# Patient Record
Sex: Female | Born: 1985 | Race: White | Hispanic: No | Marital: Single | Smoking: Never smoker
Health system: Southern US, Community
[De-identification: ages and names within clinical notes are randomized; demographics above are authoritative.]

## PROBLEM LIST (undated history)

## (undated) DIAGNOSIS — K222 Esophageal obstruction: Secondary | ICD-10-CM

## (undated) DIAGNOSIS — F988 Other specified behavioral and emotional disorders with onset usually occurring in childhood and adolescence: Secondary | ICD-10-CM

## (undated) DIAGNOSIS — K219 Gastro-esophageal reflux disease without esophagitis: Secondary | ICD-10-CM

## (undated) HISTORY — DX: Other specified behavioral and emotional disorders with onset usually occurring in childhood and adolescence: F98.8

## (undated) HISTORY — PX: NO PAST SURGERIES: SHX2092

## (undated) HISTORY — DX: Gastro-esophageal reflux disease without esophagitis: K21.9

## (undated) HISTORY — DX: Esophageal obstruction: K22.2

## (undated) HISTORY — PX: WISDOM TOOTH EXTRACTION: SHX21

---
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
## 2007-02-10 ENCOUNTER — Encounter: Payer: Self-pay | Admitting: *Deleted

## 2007-10-27 LAB — CONVERTED CEMR LAB: Pap Smear: NORMAL

## 2008-01-11 ENCOUNTER — Ambulatory Visit: Payer: Self-pay | Admitting: Internal Medicine

## 2008-01-11 DIAGNOSIS — F988 Other specified behavioral and emotional disorders with onset usually occurring in childhood and adolescence: Secondary | ICD-10-CM | POA: Insufficient documentation

## 2008-01-22 ENCOUNTER — Encounter: Payer: Self-pay | Admitting: Internal Medicine

## 2008-02-05 ENCOUNTER — Ambulatory Visit: Payer: Self-pay | Admitting: Internal Medicine

## 2008-02-05 DIAGNOSIS — J018 Other acute sinusitis: Secondary | ICD-10-CM | POA: Insufficient documentation

## 2008-02-05 LAB — CONVERTED CEMR LAB: Inflenza A Ag: NEGATIVE

## 2008-02-19 ENCOUNTER — Encounter: Payer: Self-pay | Admitting: Internal Medicine

## 2008-03-21 ENCOUNTER — Telehealth: Payer: Self-pay | Admitting: Internal Medicine

## 2008-06-13 ENCOUNTER — Encounter: Payer: Self-pay | Admitting: Internal Medicine

## 2008-07-25 ENCOUNTER — Ambulatory Visit: Payer: Self-pay | Admitting: Internal Medicine

## 2008-08-28 ENCOUNTER — Emergency Department (HOSPITAL_BASED_OUTPATIENT_CLINIC_OR_DEPARTMENT_OTHER): Admission: EM | Admit: 2008-08-28 | Discharge: 2008-08-28 | Payer: Self-pay | Admitting: Emergency Medicine

## 2008-09-08 ENCOUNTER — Ambulatory Visit: Payer: Self-pay | Admitting: Internal Medicine

## 2008-12-06 ENCOUNTER — Telehealth: Payer: Self-pay | Admitting: Internal Medicine

## 2009-01-09 ENCOUNTER — Ambulatory Visit: Payer: Self-pay | Admitting: Internal Medicine

## 2009-01-09 DIAGNOSIS — M26629 Arthralgia of temporomandibular joint, unspecified side: Secondary | ICD-10-CM | POA: Insufficient documentation

## 2009-04-04 ENCOUNTER — Ambulatory Visit: Payer: Self-pay | Admitting: Internal Medicine

## 2009-08-17 ENCOUNTER — Telehealth: Payer: Self-pay | Admitting: Internal Medicine

## 2009-08-25 ENCOUNTER — Telehealth: Payer: Self-pay | Admitting: Internal Medicine

## 2009-08-25 ENCOUNTER — Ambulatory Visit: Payer: Self-pay | Admitting: Family Medicine

## 2009-08-29 ENCOUNTER — Telehealth (INDEPENDENT_AMBULATORY_CARE_PROVIDER_SITE_OTHER): Payer: Self-pay | Admitting: *Deleted

## 2009-09-20 ENCOUNTER — Ambulatory Visit: Payer: Self-pay | Admitting: Internal Medicine

## 2009-10-25 ENCOUNTER — Ambulatory Visit: Payer: Self-pay | Admitting: Internal Medicine

## 2009-10-25 DIAGNOSIS — E663 Overweight: Secondary | ICD-10-CM | POA: Insufficient documentation

## 2009-12-13 ENCOUNTER — Telehealth: Payer: Self-pay | Admitting: Internal Medicine

## 2010-03-15 ENCOUNTER — Ambulatory Visit: Payer: Self-pay | Admitting: Internal Medicine

## 2010-04-11 ENCOUNTER — Ambulatory Visit: Admit: 2010-04-11 | Payer: Self-pay | Admitting: Internal Medicine

## 2010-04-27 ENCOUNTER — Ambulatory Visit (INDEPENDENT_AMBULATORY_CARE_PROVIDER_SITE_OTHER)
Admission: RE | Admit: 2010-04-27 | Discharge: 2010-04-27 | Payer: PRIVATE HEALTH INSURANCE | Source: Home / Self Care | Attending: Internal Medicine | Admitting: Internal Medicine

## 2010-04-27 DIAGNOSIS — H6120 Impacted cerumen, unspecified ear: Secondary | ICD-10-CM

## 2010-04-27 DIAGNOSIS — F988 Other specified behavioral and emotional disorders with onset usually occurring in childhood and adolescence: Secondary | ICD-10-CM

## 2010-05-01 NOTE — Progress Notes (Signed)
Summary: seen friday, now has drainage  Phone Note Call from Patient Call back at [facility] Phone [phone]   Caller: Patient Summary of Call: Patient called and left msg, was seen on friday, pus and drainage now started saturday, was told to call and a abx would be called in .Kandice Hams  Aug 29, 2009 9:26 AM  Initial call taken by: Kandice Hams,  Aug 29, 2009 9:26 AM  Follow-up for Phone Call        prescription sent.  please call pt Follow-up by: Neena Rhymes MD,  Aug 29, 2009 9:42 AM  Additional Follow-up for Phone Call Additional follow up Details #1::        SPOKE WITH PT INFORMED OF NEW RX .Kandice Hams  Aug 29, 2009 10:29 AM  Additional Follow-up by: Kandice Hams,  Aug 29, 2009 10:29 AM    New/Updated Medications: VIGAMOX 0.5 % SOLN (MOXIFLOXACIN HCL) 1 drop in affected eye three times a day x7 days Prescriptions: VIGAMOX 0.5 % SOLN (MOXIFLOXACIN HCL) 1 drop in affected eye three times a day x7 days  #3 ml x 1   Entered and Authorized by:   Neena Rhymes MD   Signed by:   Neena Rhymes MD on 08/29/2009   Method used:   Electronically to        [facility] [address] (retail)       [address]       [place], [place]  [address]       Ph: [phone] or [phone]       Fax: [phone]   RxID:   [id number]

## 2010-05-01 NOTE — Assessment & Plan Note (Signed)
Summary: pink eye/mhf   Vital Signs:  Patient profile:   25 year old female Weight:      181 pounds Temp:     98.8 degrees F oral BP sitting:   120 / 80  (left arm)  Vitals Entered By: Doristine Devoid (Aug 25, 2009 2:48 PM) CC: R eye redness and itching along w/ some burning    History of Present Illness: 25 yo woman here today w/ R eye redness.  woke up w/ eye 'sealed shut w/ crust' this AM.  some watery drainage today.  + itching and burning.  + contact w/ pink eye.  no fevers, chills, nasal congestion, or URI sxs.  + sneezing.  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  Allergies (verified): No Known Drug Allergies  Review of Systems      See HPI  Physical Exam  General:  alert, well-developed, and well-nourished.   Head:  normocephalic and atraumatic.   Eyes:  R eye w/ mild injxn and inflammation.  PERRL, EOMI, no drainage L eye normal Ears:  cerumen bilaterally Nose:  edematous turbinates Mouth:  + PND   Impression & Recommendations:  Problem # 1:  CONJUNCTIVITIS (ICD-372.30) Assessment New most likely allergic- thin, watery drainage and no pus.  start patanase daily for allergic sxs.  reviewed supportive care and red flags that should prompt return.  Pt expresses understanding and is in agreement w/ this plan.  Complete Medication List: 1)  Multivitamins Tabs (Multiple vitamin) .... Take one tablet once daily 2)  Vyvanse 60 Mg Caps (Lisdexamfetamine dimesylate) .... One by mouth qd 3)  Vyvanse 60 Mg Caps (Lisdexamfetamine dimesylate) .... One by mouth once daily  (fill on or after 05/05/2009) 4)  Vyvanse 60 Mg Caps (Lisdexamfetamine dimesylate) .... One po qd - fill on or after3/06/2009  Patient Instructions: 1)  This appears to be allergy related and not bacterial 2)  Use the Patanase- 2 sprays each nostril two times a day (if the samples don't last, call for a prescription) 3)  Start OTC Claritin or Zyrtec 4)  If you develop thick, yellow/green drainage/pus from  the eye please call and we can call in an antibiotic 5)  Visine Allergy drops may relieve the itching/burning of your eyes 6)  Hang in there! 7)  Have a great holiday!

## 2010-05-01 NOTE — Progress Notes (Signed)
Summary: Ear feel "full"  Phone Note Call from Patient Call back at [phone]   Caller: Mom Reason for Call: Talk to Nurse Summary of Call: Mother states her daughter is no better, her ear still feels like it's full of something even after using washes, she wants to know if there is anything else that can be prescribed Initial call taken by: Lannette Donath,  December 13, 2009 4:36 PM  Follow-up for Phone Call        call returned to patient at [phone]. Patients mother Connie Murillo states  patient has tried the over the counter stuff that Dr Artist Pais advised, and she is not any better. Connie Murillo was wanting to know if Dr Artist Pais would prescribed something for the patient. She was advised that patient will need appointment for evaluation if there was no improvement.  The patients mother is wanting to know if patient could be referred to a ENT, she is currently away at school, and is unable to come in for appointment. She would like to know what Dr Artist Pais advises Follow-up by: Glendell Docker CMA,  December 13, 2009 5:02 PM  Additional Follow-up for Phone Call Additional follow up Details #1::        ok to refer to ENT near her school pt can call her ins co to see list of ENTs that accept her ins Additional Follow-up by: D. Thomos Lemons DO,  December 13, 2009 8:55 PM    Additional Follow-up for Phone Call Additional follow up Details #2::    Left detailed message on mother's cell phone re: Dr Olegario Messier instructions and to call us back when she knows the name of an ENT around her daughter's school. [name] CMA Duncan Dull)  December 14, 2009 8:31 AM

## 2010-05-01 NOTE — Assessment & Plan Note (Signed)
Summary: vyvanse refill/mhf--Rm 2   Vital Signs:  Patient profile:   25 year old female Height:      65 inches Weight:      181.25 pounds BMI:     30.27 Temp:     98.5 degrees F oral Pulse rate:   72 / minute Pulse rhythm:   regular Resp:     16 per minute BP sitting:   120 / 88  (left arm) Cuff size:   regular  Vitals Entered By: Mervin Kung CMA Duncan Dull) (October 25, 2009 11:17 AM) CC: Room 2    Follow for refill of  Vyvanse. Is Patient Diabetic? No Comments Pt has completed Vigamox. 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   Lungs:  normal respiratory effort and normal breath sounds.   Heart:  normal rate, regular rhythm, and no gallop.   Psych:  normally interactive, good eye contact, not anxious appearing, and not depressed appearing.     Impression & Recommendations:  Problem # 1:  ADD (ICD-314.00) Assessment Unchanged continue vyvanse  Problem # 2:  OVERWEIGHT (ICD-278.02) Pt counseled on diet and exercise.  Ht: 65 (10/25/2009)   Wt: 181.25 (10/25/2009)   BMI: 30.27 (10/25/2009)  Complete Medication List: 1)  Multivitamins Tabs (Multiple vitamin) .... Take one tablet once daily 2)  Vyvanse 60 Mg Caps (Lisdexamfetamine dimesylate) .... One by mouth qd 3)  Vyvanse 60 Mg Caps (Lisdexamfetamine dimesylate) .... One by mouth once daily  (fill on or after 11/25/2009) 4)  Vyvanse 60 Mg Caps (Lisdexamfetamine dimesylate) .... One po qd - fill on or after  12/26/2009   Patient Instructions: 1)  Please schedule a follow-up appointment in 3 months ( Nurse visit) Prescriptions: VYVANSE 60 MG CAPS (LISDEXAMFETAMINE DIMESYLATE) one po qd - fill on or after 12/26/2009  #30 x 0   Entered and Authorized by:   D. Thomos Lemons DO   Signed by:   D. Thomos Lemons DO on 10/25/2009   Method used:   Print then Give to Patient   RxID:   [id number] VYVANSE 60 MG CAPS (LISDEXAMFETAMINE DIMESYLATE) one by mouth once daily  (fill on or after 11/25/2009)  #30 x 0   Entered and Authorized by:   D. Thomos Lemons DO   Signed by:   D. Thomos Lemons DO on 10/25/2009   Method used:   Print then Give to Patient   RxID:   [id number] VYVANSE 60 MG CAPS (LISDEXAMFETAMINE DIMESYLATE) one by mouth qd  #30 x 0   Entered and Authorized by:   D. Thomos Lemons DO   Signed by:   D. Thomos Lemons DO on 10/25/2009   Method used:   Print then Give to Patient   RxID:   [id number]   Current Allergies (reviewed today): No known allergies

## 2010-05-01 NOTE — Progress Notes (Signed)
Summary: Vyvanse refill  Phone Note Refill Request Call back at [phone] Message from:  Patient on Aug 17, 2009 10:55 AM  Refills Requested: Medication #1:  VYVANSE 60 MG CAPS one by mouth qd   Dosage confirmed as above?Dosage Confirmed   Supply Requested: 1 month Pt. scheduled f/u for 09/20/09 @ 9am.  She will be out of Vyvanse before she returns from school and would like rx. mailed to home address for her mother to forward to her at school.  Mervin Kung CMA  Aug 17, 2009 10:56 AM    Follow-up for Phone Call        we can not prescribe w/o office visit  Follow-up by: D. Thomos Lemons DO,  Aug 17, 2009 2:04 PM  Additional Follow-up for Phone Call Additional follow up Details #1::        patient advised per Dr Artist Pais instructions Additional Follow-up by: Glendell Docker CMA,  Aug 17, 2009 2:43 PM

## 2010-05-01 NOTE — Assessment & Plan Note (Signed)
Summary: 3 month follow up/mhf   Vital Signs:  Patient profile:   25 year old female Weight:      163.50 pounds BMI:     27.31 O2 Sat:      98 % on Room air Temp:     98.3 degrees F oral Pulse rate:   72 / minute Pulse rhythm:   regular Resp:     16 per minute BP sitting:   120 / 90  (left arm) Cuff size:   large  Vitals Entered By: Glendell Docker CMA (April 04, 2009 3:09 PM)  O2 Flow:  Room air  Contraindications/Deferment of Procedures/Staging:    Test/Procedure: FLU VAX    Reason for deferment: patient declined   Primary Care Provider:  DThomos Lemons DO  CC:  3 month follow up.  History of Present Illness: 3 month follow up evaulation of right ear-sense of fullness for the past week  25 year old for routine followup regarding ADD. Her symptoms are well-controlled. She had very good previous semester. Mild weight loss.  She also complains of fullness in right ear. She has history of cerumen impaction. 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   Ears:  right cerumen impaction Lungs:  normal respiratory effort and normal breath sounds.   Heart:  normal rate, regular rhythm, and no gallop.     Impression & Recommendations:  Problem # 1:  CERUMEN IMPACTION, RIGHT (ICD-380.4) Assessment New  irrigated right ear and utilized currette to remove cerumen plug. No complications. Immediate hearing improvement.  Orders: Cerumen Impaction Removal (36644)  Problem # 2:  ADD (ICD-314.00) Assessment: Unchanged stable.  mild wt loss.  BP stable.   Maintain current medication regimen.  Complete Medication List: 1)  Multivitamins Tabs (Multiple vitamin) .... Take one tablet once daily 2)  Vyvanse 60 Mg Caps (Lisdexamfetamine dimesylate) .... One by mouth qd 3)  Vyvanse 60 Mg Caps (Lisdexamfetamine dimesylate) .... One by mouth once daily  (fill on or after 05/05/2009) 4)  Vyvanse 60 Mg Caps (Lisdexamfetamine dimesylate) .... One po qd - fill on or after3/06/2009  Patient Instructions: 1)  Use debox ear wax softener 2)  Use warm water mixed with hydrogen peroxide to irrigate your ears. 3)  Please schedule a follow-up appointment in 3 months. Prescriptions: VYVANSE 60 MG CAPS (LISDEXAMFETAMINE DIMESYLATE) one po qd - fill on or after3/06/2009  #30 x 0   Entered and Authorized by:   D. Thomos Lemons DO   Signed by:   D. Thomos Lemons DO on 04/04/2009   Method used:   Print then Give to Patient   RxID:   [id number] VYVANSE 60 MG CAPS (LISDEXAMFETAMINE DIMESYLATE) one by mouth once daily  (fill on or after 05/05/2009)  #30 x 0   Entered and Authorized by:   D. Thomos Lemons DO   Signed by:   D. Thomos Lemons DO on 04/04/2009   Method used:   Print then Give to Patient   RxID:   [id number] VYVANSE 60 MG CAPS (LISDEXAMFETAMINE DIMESYLATE) one by mouth qd  #  30 x 0   Entered and Authorized by:   D. Thomos Lemons DO   Signed by:   D. Thomos Lemons DO on 04/04/2009   Method used:   Print then Give to Patient   RxID:   [id number]    Immunization History:  Influenza Immunization History:    Influenza:  declined (04/04/2009)   Current Allergies (reviewed today): No known allergies

## 2010-05-01 NOTE — Progress Notes (Signed)
Summary: Eye Irritation  Phone Note Call from Patient Call back at Home Phone [phone]   Caller: Patient Call For: D. Thomos Lemons DO Summary of Call: patient called and left voice message stating she has swelling in her eyes , irritation and drainage, and drainage she is wanting to know if she could be seen.  Call was returned to patient she was informed that office visit was needed for evaluation. She has been scheduled with the [facility] office Initial call taken by: Glendell Docker CMA,  Aug 25, 2009 1:48 PM

## 2010-05-17 NOTE — Assessment & Plan Note (Signed)
Summary: MEDICATION REFILL/HEA   Vital Signs:  Patient profile:   25 year old female Height:      65 inches Weight:      182.50 pounds BMI:     30.48 O2 Sat:      98 % on Room air Temp:     97.9 degrees F oral Pulse rate:   71 / minute Resp:     18 per minute BP sitting:   112 / 80  (left arm) Cuff size:   large  Vitals Entered By: Glendell Docker CMA (April 27, 2010 2:37 PM)  O2 Flow:  Room air CC: Medication Refill Is Patient Diabetic? No Pain Assessment Patient in pain? no      Comments c/o fullness in ears with difficulty hearing, onset Monday    Primary Care Provider:  Dondra Spry DO  CC:  Medication Refill.  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   Ears:  bilateral cerumen impaction Lungs:  normal respiratory effort and normal breath sounds.   Heart:  normal rate, regular rhythm, and no gallop.   Psych:  normally interactive, good eye contact, not anxious appearing, and not depressed appearing.     Impression & Recommendations:  Problem # 1:  CERUMEN IMPACTION, BILATERAL (ICD-380.4)  irrigated and utilized currette to remove cerumen plug from left and right ear  Orders: Cerumen Impaction Removal (29562)  Problem # 2:  ADD (ICD-314.00) Assessment: Unchanged Maintain current medication  regimen.  Complete Medication List: 1)  Multivitamins Tabs (Multiple vitamin) .... Take one tablet once daily 2)  Vyvanse 60 Mg Caps (Lisdexamfetamine dimesylate) .... One by mouth qd 3)  Vyvanse 60 Mg Caps (Lisdexamfetamine dimesylate) .... One by mouth once daily  (fill on or after 05/28/2010) 4)  Vyvanse 60 Mg Caps (Lisdexamfetamine dimesylate) .... One by mouth once daily (fill on or after 06/26/2010)  Patient Instructions: 1)  Please schedule a follow-up appointment in 5 months. Prescriptions: VYVANSE 60 MG CAPS (LISDEXAMFETAMINE DIMESYLATE) one by mouth once daily (fill on or after 06/26/2010)  #30 x 0   Entered and Authorized by:   D. Thomos Lemons DO   Signed by:   D. Thomos Lemons DO on 04/27/2010   Method used:   Print then Give to Patient   RxID:   [id number] VYVANSE 60 MG CAPS (LISDEXAMFETAMINE DIMESYLATE) one by mouth once daily  (fill on or after 05/28/2010)  #30 x 0   Entered and Authorized by:   D. Thomos Lemons DO   Signed by:   D. Thomos Lemons DO on 04/27/2010   Method used:   Print then Give to Patient   RxID:   [id number] VYVANSE 60 MG CAPS (LISDEXAMFETAMINE DIMESYLATE) one by mouth qd  #30 x 0   Entered and Authorized by:  Dondra Spry DO   Signed by:   D. Thomos Lemons DO on 04/27/2010   Method used:   Print then Give to Patient   RxID:   [id number]    Orders Added: 1)  Est. Patient Level III [14782] 2)  Cerumen Impaction Removal [69210]   Immunization History:  Influenza Immunization History:    Influenza:  declined (04/27/2010)   Immunization History:  Influenza Immunization History:    Influenza:  Declined (04/27/2010)

## 2010-06-27 ENCOUNTER — Telehealth: Payer: Self-pay | Admitting: *Deleted

## 2010-06-27 NOTE — Telephone Encounter (Signed)
Patients mother called and left voice message stating patient misplaced her last Vyvanse rx and she would like to know if Dr Artist Pais would authorize a rx  And mail it to patient at school [address], [place], [place] [address]

## 2010-06-28 NOTE — Telephone Encounter (Signed)
Pt responsible for her rx.   Vyvanse is controlled substance.   We can not provide replacement rx

## 2010-06-28 NOTE — Telephone Encounter (Signed)
Call returned to patients mother Connie Murillo  at [phone], no answer. A detailed voice message was left informing patients mother per Dr Artist Pais instructions

## 2010-08-17 ENCOUNTER — Telehealth: Payer: Self-pay | Admitting: Internal Medicine

## 2010-08-17 DIAGNOSIS — F988 Other specified behavioral and emotional disorders with onset usually occurring in childhood and adolescence: Secondary | ICD-10-CM

## 2010-08-17 NOTE — Assessment & Plan Note (Signed)
[facility]                           PRIMARY CARE OFFICE NOTE   NAME:Connie Murillo, Connie Murillo                  MRN:          [id number]  DATE:08/04/2006                            DOB:          02-Jan-1986    CHIEF COMPLAINT:  New patient to the practice/cough and sore throat.   HISTORY OF PRESENT ILLNESS:  The patient is an 25 year old white female  here to establish primary care.  She was previously followed by Dr.  Oliver Pila, local pediatrician.  She has had routine childhood illnesses.  She is not sure whether she has had chickenpox, also unsure whether she  received the varicella vaccine.  Her main complaint today has been cough  and a sore throat for 1 week.  There is no associated fever, some mild  yellowish sputum.  Several of her friends are noted to have similar  symptoms.  She denies any swollen lymph nodes and denies excessive  fatigue.   CURRENT MEDICATIONS:  1. Tylenol p.r.n.  2. Multivitamin.   ALLERGIES TO MEDICATIONS:  None known.   SOCIAL HISTORY:  The patient is single, currently a senior at the  [employer].   FAMILY HISTORY:  Mother and father are living; mother is 21; father is  age 20.  Father has asthma.  She also has an older brother with asthma.   REVIEW OF SYSTEMS:  As noted above.  Positive postnasal drip.  No chest  pain.  No history of coughing or asthma with exercise.  Denies  heartburn, nausea, vomiting, constipation or diarrhea.  All other  systems negative.   PHYSICAL EXAMINATION:  VITALS:  Weight is 140 pounds.  Temperature is  98.4, pulse is 72, BP is 117/82.  GENERAL:  The patient is a pleasant, well-developed, well-nourished 41-  year-old white female in no apparent distress.  HEENT:  Normocephalic, atraumatic.  Pupils are equal and reactive to  light bilaterally.  Extraocular motility was intact.  The patient was  anicteric.  Conjunctivae were within normal limits.  The patient's  oropharyngeal  exam revealed mild cobblestoning and mild pharyngeal  erythema, otherwise unremarkable.  There were no tonsillar exudates.  NECK:  Supple without any evidence of adenopathy, carotid bruit or  thyromegaly.  CHEST:  Normal respiratory effort.  Chest was clear to auscultation  bilaterally, no rhonchi, rales or wheezing.  CARDIOVASCULAR:  Regular rate and rhythm.  No significant murmurs, rubs,  or gallops appreciated.  ABDOMEN:  Soft and nontender.  Positive bowel sounds.  Unable to palpate  her spleen or liver.  MUSCULOSKELETAL:  No clubbing, cyanosis or edema.  SKIN:  Warm and dry.  NEUROLOGIC:  Cranial nerves II-XII were grossly intact.  She was  nonfocal.   Rapid Strep test was performed in the office, which was negative.   IMPRESSION:  1. Cough secondary to probable upper respiratory infection.  2. Health maintenance.   RECOMMENDATIONS:  The patient is to try Zyrtec 10 mg p.o. nightly and  also nasal irrigation.  If the patient's symptoms do not resolve or the  patient has fever and persistent yellow-green nasal discharge, she is to  take Ceftin 500 mg p.o. b.i.d. for 10 days.   I recommended Gardasil and we will need to ascertain status of other  immunizations.  We will request immunization records from her  pediatrician.   I recommended continued followup with her OB/GYN for routine Pap and  pelvics.  Follow up in 1 or 2 months.     Barbette Hair. Artist Pais, DO  Electronically Signed    RDY/MedQ  DD: 08/04/2006  DT: 08/05/2006  Job #: [id number]

## 2010-08-17 NOTE — Telephone Encounter (Signed)
Pt mom states that pt is out of vyvance and would like [place] to mail rx to pt.   [address]  [place] [address]

## 2010-08-17 NOTE — Telephone Encounter (Signed)
If pt still in school at [employer], she can have one vyvanse refill. But she needs OV for additional refills

## 2010-08-20 NOTE — Telephone Encounter (Signed)
Call placed  [phone], no answer. A detailed voice message was left for patient to return phone call regarding her Rx.

## 2010-08-20 NOTE — Telephone Encounter (Signed)
Pt returned phone call. Pt states that she is still in school at [employer]. Best # [phone] to reach her at.

## 2010-08-21 MED ORDER — LISDEXAMFETAMINE DIMESYLATE 60 MG PO CAPS: 60.0000 mg | ORAL_CAPSULE | ORAL | Status: DC

## 2010-08-21 NOTE — Telephone Encounter (Signed)
Call placed to patient at [phone], she was informed per Dr Artist Pais instructions of the need for follow up office visit. She has verbalized understanding and stated that she will be back in the area in about 3 weeks. She requested the Rx be mailed to her at  [address] [place], [place] [address].

## 2010-08-21 NOTE — Telephone Encounter (Signed)
Rx mailed to patient to address on file

## 2010-09-04 ENCOUNTER — Telehealth: Payer: Self-pay | Admitting: Internal Medicine

## 2010-09-04 NOTE — Telephone Encounter (Signed)
Pt states that she has never received vyvance rx through the mail(previous note). Pt would like another rx sent to mom's address at [address] [place], [place] [address]

## 2010-09-05 NOTE — Telephone Encounter (Signed)
Call returned to patient at [phone], she was advised to wait until the end of the week to see if she has received the rx, and if not she was advised to call back. Patient has verbalized understanding and agrees

## 2010-09-11 ENCOUNTER — Telehealth: Payer: Self-pay | Admitting: Internal Medicine

## 2010-09-11 NOTE — Telephone Encounter (Signed)
Pt states that she has not received her medication    Vyvanse 60mg   (she is school in [place] )  Please call   [phone]

## 2010-09-12 NOTE — Telephone Encounter (Signed)
Patient has requested mailed  Rx for Vyvanse. Rx was mailed to patients provided address in [place], [place], which patients states she has not received. She is requesting another Rx to be mailed to her at her local address. She was previously informed. Authorization may not be granted for addittonal refill and she may be required to return for office visit which she has verbalized understanding at the time. Patient has verified that the address previously provided for Connie Murillo is correct as documented. She was informed Dr Artist Pais is out of the office and I would authorization would be up to covering provider. She has verbalized understanding that the Rx may or may not be refilled.

## 2010-09-12 NOTE — Telephone Encounter (Signed)
Call place to patient at [phone], she was informed office visit was needed; sent to appointments to schedule

## 2010-09-12 NOTE — Telephone Encounter (Signed)
She needs office visit pls.

## 2010-09-19 ENCOUNTER — Encounter: Payer: Self-pay | Admitting: Internal Medicine

## 2010-09-21 ENCOUNTER — Encounter: Payer: Self-pay | Admitting: Internal Medicine

## 2010-09-21 ENCOUNTER — Ambulatory Visit: Payer: PRIVATE HEALTH INSURANCE | Admitting: Family

## 2010-09-21 ENCOUNTER — Ambulatory Visit (INDEPENDENT_AMBULATORY_CARE_PROVIDER_SITE_OTHER): Admitting: Family

## 2010-09-21 ENCOUNTER — Encounter: Payer: Self-pay | Admitting: Family

## 2010-09-21 VITALS — BP 100/70 | HR 60 | Temp 98.0°F | Resp 16 | Ht 65.0 in | Wt 169.0 lb

## 2010-09-21 DIAGNOSIS — F988 Other specified behavioral and emotional disorders with onset usually occurring in childhood and adolescence: Secondary | ICD-10-CM

## 2010-09-21 MED ORDER — LISDEXAMFETAMINE DIMESYLATE 60 MG PO CAPS: 60.0000 mg | ORAL_CAPSULE | ORAL | Status: DC

## 2010-09-21 NOTE — Patient Instructions (Signed)
Follow up in 6 months 

## 2010-09-21 NOTE — Progress Notes (Signed)
  Subjective:    Patient ID: Connie Murillo, female    DOB: October 27, 1985, 25 y.o.   MRN: [id number]  HPI Connie Murillo is a 25 year old college due to who presents today for followup of her ADD.  ADD- diagnosed in 2010, now on Vayvanse.  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  She reports that she has been able to focus on her school work.  She reports that she has been trying to lose weight.  She has lost greater than 10 pounds since her last visit in January. She denies any adverse side effects from the Vyvanse.    Review of Systems See history of present illness  Past Medical History  Diagnosis Date  . ADD (attention deficit disorder)     History   Social History  . Marital Status: Single    Spouse Name: N/A    Number of Children: N/A  . Years of Education: N/A   Occupational History  . Not on file.   Social History Main Topics  . Smoking status: Never Smoker   . Smokeless tobacco: Not on file  . Alcohol Use: Not on file  . Drug Use: Not on file  . Sexually Active: Not on file   Other Topics Concern  . 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 No past surgeries     denies surgical history    Family History  Problem Relation Age of Onset  . Asthma Father   . Asthma Brother   . Other Neg Hx     No FH of thyroid d/o    No Known Allergies  Current Outpatient Prescriptions on File Prior to Visit  Medication Sig Dispense Refill  . lisdexamfetamine (VYVANSE) 60 MG capsule Take 60 mg by mouth every morning. Fill on or after      . Multiple Vitamin (MULTIVITAMIN) tablet Take 1 tablet by mouth daily.        Marland Kitchen lisdexamfetamine (VYVANSE) 60 MG capsule Take 60 mg by mouth every morning. Fill on or after      . DISCONTD: lisdexamfetamine (VYVANSE) 60 MG capsule Take 1 capsule (60 mg total) by mouth every morning.  30 capsule  0  . DISCONTD:  lisdexamfetamine (VYVANSE) 60 MG capsule Take 1 capsule (60 mg total) by mouth every morning.  30 capsule  0    BP 100/70  Pulse 60  Temp(Src) 98 F (36.7 C) (Oral)  Resp 16  Ht 5\' 5"  (1.651 m)  Wt 169 lb (76.658 kg)  BMI 28.12 kg/m2  LMP 09/07/2010       Objective:   Physical Exam  General: Pleasant white female awake, alert, and in no acute distress Cardiovascular S1, S2, regular rate and rhythm Respiratory: Breath sounds clear to auscultation bilaterally without wheezes rales or rhonchi Psychiatric: Awake and oriented x3 calm and pleasant.        Assessment & Plan:

## 2010-09-21 NOTE — Assessment & Plan Note (Addendum)
This is clinically stable and improved. Continue Vyvanse at current dose. Followup in 6 months. A printed prescription for a one-month supply was handed to the patient today. 15 minutes were spent with the patient today. Greater than 50% of this time spent counseling the patient on her ADD.

## 2010-10-22 ENCOUNTER — Encounter: Payer: Self-pay | Admitting: *Deleted

## 2010-10-22 ENCOUNTER — Telehealth: Payer: Self-pay | Admitting: Internal Medicine

## 2010-10-22 NOTE — Telephone Encounter (Signed)
Spoke with patient's mother. Patient is seeing her PCP tomorrow for difficulty swallowing for several weeks. Offered to schedule patient with an extender but patient's mother wants her to see Dr. Juanda Chance. Scheduled her on 11/05/10 at 9:00 AM. Letter mailed.

## 2010-10-23 ENCOUNTER — Ambulatory Visit (INDEPENDENT_AMBULATORY_CARE_PROVIDER_SITE_OTHER): Admitting: Internal Medicine

## 2010-10-23 ENCOUNTER — Ambulatory Visit: Admitting: Family

## 2010-10-23 ENCOUNTER — Telehealth: Payer: Self-pay

## 2010-10-23 ENCOUNTER — Encounter: Payer: Self-pay | Admitting: Internal Medicine

## 2010-10-23 ENCOUNTER — Ambulatory Visit: Admitting: Internal Medicine

## 2010-10-23 DIAGNOSIS — F988 Other specified behavioral and emotional disorders with onset usually occurring in childhood and adolescence: Secondary | ICD-10-CM

## 2010-10-23 DIAGNOSIS — Z0000 Encounter for general adult medical examination without abnormal findings: Secondary | ICD-10-CM | POA: Insufficient documentation

## 2010-10-23 DIAGNOSIS — K219 Gastro-esophageal reflux disease without esophagitis: Secondary | ICD-10-CM | POA: Insufficient documentation

## 2010-10-23 DIAGNOSIS — R1310 Dysphagia, unspecified: Secondary | ICD-10-CM | POA: Insufficient documentation

## 2010-10-23 MED ORDER — ESOMEPRAZOLE MAGNESIUM 40 MG PO CPDR: 40.0000 mg | DELAYED_RELEASE_CAPSULE | Freq: Every day | ORAL | Status: AC

## 2010-10-23 MED ORDER — LISDEXAMFETAMINE DIMESYLATE 60 MG PO CAPS: 60.0000 mg | ORAL_CAPSULE | ORAL | Status: DC

## 2010-10-23 MED ORDER — PANTOPRAZOLE SODIUM 40 MG PO TBEC: 40.0000 mg | DELAYED_RELEASE_TABLET | Freq: Every day | ORAL | Status: DC

## 2010-10-23 NOTE — Assessment & Plan Note (Signed)
Ok for protonix 40 qd,  to f/u any worsening symptoms or concerns  

## 2010-10-23 NOTE — Assessment & Plan Note (Signed)
To solid and liquids, I suspect reflux related/functional, for PPI tx and refer to GI per pt request, for barium esophogram as well

## 2010-10-23 NOTE — Progress Notes (Signed)
  Subjective:    Patient ID: Connie Murillo, female    DOB: 1986-01-22, 25 y.o.   MRN: [id number]  HPI  Here to f/u, is established pt with Dr Artist Pais, who has health problem, now out of the office with pt transferring care.   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  Mother has seen Dr Marlowe Shores with egd and dilation and since she has trouble with solids and liquids that she might need the same.  Has not tried any OTC meds, and Denies abd pain, n/v, bowel change or blood or wt loss.  Also requests vyvanse refill, curretnly doing well with good concentration, task completion, completing college soon with only one semester left.  Pt denies chest pain, increased sob or doe, wheezing, orthopnea, PND, increased LE swelling, palpitations, dizziness or syncope.  Pt denies new neurological symptoms such as new headache, or facial or extremity weakness or numbness  Pt denies polydipsia, polyuria.  Denies worsening depressive symptoms, suicidal ideation, or panic. Past Medical History  Diagnosis Date  . ADD (attention deficit disorder)    Past Surgical History  Procedure Date  . No past surgeries     denies surgical history    reports that she has never smoked. She does not have any smokeless tobacco history on file. Her alcohol and drug histories not on file. family history includes Asthma in her brother and father.  There is no history of Other. No Known Allergies Current Outpatient Prescriptions on File Prior to Visit  Medication Sig Dispense Refill  . drospirenone-ethinyl estradiol (YAZ) 3-0.02 MG per tablet Take 1 tablet by mouth daily.        . Multiple Vitamin (MULTIVITAMIN) tablet Take 1 tablet by mouth daily.         Review of Systems Review of Systems  Constitutional: Negative for diaphoresis and unexpected weight change.  HENT: Negative for drooling and  tinnitus.   Eyes: Negative for photophobia and visual disturbance.  Respiratory: Negative for choking and stridor.   Gastrointestinal: Negative for vomiting and blood in stool.  Genitourinary: Negative for hematuria and decreased urine volume.        Objective:   Physical Exam BP 112/82  Pulse 65  Temp(Src) 99.4 F (37.4 C) (Oral)  Ht 5\' 3"  (1.6 m)  Wt 161 lb 2 oz (73.086 kg)  BMI 28.54 kg/m2  SpO2 99%  LMP 10/10/2010 Physical Exam  VS noted Constitutional: Pt appears well-developed and well-nourished.  HENT: Head: Normocephalic.  Right Ear: External ear normal.  Left Ear: External ear normal.  Eyes: Conjunctivae and EOM are normal. Pupils are equal, round, and reactive to light.  Neck: Normal range of motion. Neck supple.  Cardiovascular: Normal rate and regular rhythm.   Pulmonary/Chest: Effort normal and breath sounds normal.  Abd:  Soft, NT, +BS Neurological: Pt is alert. No cranial nerve deficit.  Skin: Skin is warm. No erythema.  Psychiatric: Pt behavior is normal. Thought content normal. Not anxious or depressed appearing       Assessment & Plan:

## 2010-10-23 NOTE — Telephone Encounter (Signed)
Ok for nexium, as the protonix is only by PA

## 2010-10-23 NOTE — Telephone Encounter (Signed)
Pharmacy requesting that Pantoprazole by prior authorized or try Prilosec or nexium, please advise

## 2010-10-23 NOTE — Patient Instructions (Signed)
Take all new medications as prescribed - the protonix (generic) Continue all other medications as before - including the vyvanse refill today You will be contacted regarding the referral for: Esophagram, and Dr Marlowe Shores Please return in 1 year for your yearly visit, or sooner if needed

## 2010-10-23 NOTE — Assessment & Plan Note (Signed)
stable overall by hx and exam, and pt to continue medical treatment as before 

## 2010-10-24 NOTE — Telephone Encounter (Signed)
Ok for nexium need dosage and directions for this patient

## 2010-10-24 NOTE — Telephone Encounter (Signed)
I already sent in new rx

## 2010-10-30 ENCOUNTER — Ambulatory Visit (HOSPITAL_COMMUNITY)
Admission: RE | Admit: 2010-10-30 | Discharge: 2010-10-30 | Disposition: A | Discharge status: Home / Self Care | Source: Ambulatory Visit | Attending: Internal Medicine | Admitting: Internal Medicine

## 2010-10-30 DIAGNOSIS — K449 Diaphragmatic hernia without obstruction or gangrene: Secondary | ICD-10-CM | POA: Insufficient documentation

## 2010-10-30 DIAGNOSIS — R1310 Dysphagia, unspecified: Secondary | ICD-10-CM | POA: Insufficient documentation

## 2010-11-01 ENCOUNTER — Telehealth: Payer: Self-pay | Admitting: Internal Medicine

## 2010-11-01 NOTE — Telephone Encounter (Signed)
Patient's mother wants to know if patient could be seen before she returns to school on Monday. Told her I have no appointments.

## 2010-11-01 NOTE — Telephone Encounter (Signed)
Left a message for Janet to call me.

## 2010-11-05 ENCOUNTER — Encounter: Payer: Self-pay | Admitting: Internal Medicine

## 2010-11-05 ENCOUNTER — Telehealth: Payer: Self-pay | Admitting: Internal Medicine

## 2010-11-05 ENCOUNTER — Ambulatory Visit (INDEPENDENT_AMBULATORY_CARE_PROVIDER_SITE_OTHER): Admitting: Internal Medicine

## 2010-11-05 VITALS — BP 118/80 | HR 80 | Ht 63.5 in | Wt 162.0 lb

## 2010-11-05 DIAGNOSIS — R1319 Other dysphagia: Secondary | ICD-10-CM

## 2010-11-05 MED ORDER — LORAZEPAM 0.5 MG PO TABS: ORAL_TABLET | ORAL | Status: DC

## 2010-11-05 NOTE — Patient Instructions (Addendum)
You have been scheduled for an endoscopy. Please follow written instructions given to you at your visit today. CC: Dr Thomos Lemons, DR J.John

## 2010-11-05 NOTE — Telephone Encounter (Signed)
Ativan .5 mg. #3,  1 po q 8hrs prior to the procedure, may take the first one the night before the procedure

## 2010-11-05 NOTE — Telephone Encounter (Signed)
Spoke with patient and she has high anxiety about the EGD and wants to know if Dr. Juanda Chance will prescribe something to take prior to the procedure. Please, advise.

## 2010-11-05 NOTE — Progress Notes (Signed)
Connie Murillo 1986-03-04 MRN [id number]    History of Present Illness:  This is a 25 year old white female with a 6 month history of intermittent dysphagia to solids and liquids. She has episodes of regurgitation of food or liquids; she had an episode of choking on a sandwich yesterday. Symptoms started in February 2012 and were occuring once a month initially but recently she has had episodes every several days. She denies heartburn. She was put on Nexium 40 mg daily 2 weeks ago but has not noticed any difference. She denies any problems with her GI tract previously. She denies joint pains or thyroid problems. There have been no weight changes. A barium swallow on 10/23/2010 showed normal esophageal motility with a small hiatal hernia and no evidence of stricture. She is on medication for ADD including Vyvanse 60 mg daily.   Past Medical History  Diagnosis Date  . ADD (attention deficit disorder)   . GERD (gastroesophageal reflux disease)   . Hiatal hernia    Past Surgical History  Procedure Date  . No past surgeries     denies surgical history    reports that she has never smoked. She has never used smokeless tobacco. She reports that she drinks alcohol. She reports that she does not use illicit drugs. family history includes Asthma in her brother and father and Irritable bowel syndrome in her mother.  There is no history of Other and Colon cancer. No Known Allergies      Review of Systems: Denies change in bowel habits, weight changes, shortness of breath or chest pain  The remainder of the 10  point ROS is negative except as outlined in H&P   Physical Exam: General appearance  Well developed, in no distress. Eyes- non icteric. HEENT nontraumatic, normocephalic. Mouth no lesions, tongue papillated, no cheilosis. Neck supple without adenopathy, thyroid not enlarged, no carotid bruits, no JVD. Lungs Clear to auscultation bilaterally. Cor normal S1 normal S2, regular  rhythm , no murmur,  quiet precordium. Abdomen soft nontender asked abdomen with normal active bowel sounds. No distention. Liver edge at costal margin. Rectal: Not done. Extremities no pedal edema. Skin no lesions. Neurological alert and oriented x 3. Psychological normal mood and affect.  Assessment and Plan:  Problem #1 Dysphagia to solids and liquids but predominantly to solids without evidence of an esophageal stricture on a recent barium swallow. Esophageal dysmotility may be a problem, although propulsive peristalsis on her x-ray appeared normal. Schatzki's ring or web are possibilities as well. Systemic diseases such as MS or collagen vascular disease need to be ruled out. We will proceed with an upper endoscopy and empirical dilatation. She is to continue on Nexium 40 mg daily. We may need to consider esophageal manometry to assess esophageal motility. I looked up the side effects of her medications but none of them cause dysphagia.   11/05/2010 Connie Murillo

## 2010-11-05 NOTE — Telephone Encounter (Signed)
Rx faxed to pharmacy. Patient notified of Dr. Brodie's recommendations. 

## 2010-11-13 ENCOUNTER — Encounter: Payer: Self-pay | Admitting: Internal Medicine

## 2010-11-13 ENCOUNTER — Ambulatory Visit (AMBULATORY_SURGERY_CENTER): Admitting: Internal Medicine

## 2010-11-13 DIAGNOSIS — K210 Gastro-esophageal reflux disease with esophagitis: Secondary | ICD-10-CM

## 2010-11-13 DIAGNOSIS — R1319 Other dysphagia: Secondary | ICD-10-CM

## 2010-11-13 MED ORDER — SODIUM CHLORIDE 0.9 % IV SOLN
500.0000 mL | INTRAVENOUS | Status: DC
Start: 2010-11-13 — End: 2010-11-13

## 2010-11-13 NOTE — Progress Notes (Signed)
Pt was difficult to sedate.  Dr. Juanda Chance ordered the medication.  t tolerate the exam well. maw

## 2010-11-14 ENCOUNTER — Telehealth: Payer: Self-pay | Admitting: *Deleted

## 2010-11-14 ENCOUNTER — Telehealth: Payer: Self-pay | Admitting: Internal Medicine

## 2010-11-14 ENCOUNTER — Encounter: Payer: Self-pay | Admitting: *Deleted

## 2010-11-14 NOTE — Telephone Encounter (Signed)
No answer. Left message on voicemail

## 2010-11-14 NOTE — Telephone Encounter (Signed)
Dr Juanda Chance out of the office so paged dr Christella Hartigan as he is the on call dr for today per [facility]. Notified dr Christella Hartigan of patients complaints, informed him of dilation yesterday and biopsies taken. Per dr Christella Hartigan pt can use tylenol or motrin for pain and discomfort and that if the pain worsens or changes she needs to go to ER or call the office. Dr Christella Hartigan also said pt should be fine to work this weekend. Pt given all instructions as per MD and returned verbal understanding of all instructions. Pt asked if she could have a dr note for yesterday. Told yes and she can pick it up at the front desk.  EWM

## 2010-11-14 NOTE — Telephone Encounter (Signed)
Left message on cell phone

## 2010-11-14 NOTE — Telephone Encounter (Signed)
Pt states she has sore chest since her endoscopy yesterday. If she moves a certain way or picks up something it hurts in her chest area between her breasts. No fever noted. She has been able to eat and drink without problems. She has been burping a lot since yesterday. Pt noted pain this am when she woke . If she is sitting is a 5-6 out of 10 on pain scale and if she moves or picks up something is a 7-8 out of 10. She has no cough, she has no trouble breathing,and she has not vomited. Pt states she did not have this pain before the procedure yesterday.  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 Please advise. EWM

## 2010-11-15 ENCOUNTER — Encounter: Payer: Self-pay | Admitting: Internal Medicine

## 2010-11-15 ENCOUNTER — Other Ambulatory Visit: Payer: Self-pay | Admitting: *Deleted

## 2010-11-15 DIAGNOSIS — K219 Gastro-esophageal reflux disease without esophagitis: Secondary | ICD-10-CM

## 2010-11-15 MED ORDER — SUCRALFATE 1 GM/10ML PO SUSP: 1.0000 g | Freq: Two times a day (BID) | ORAL | Status: DC

## 2010-11-15 NOTE — Telephone Encounter (Signed)
Pt calling back and states she is still having soreness and discomfort in her chest between her breast.  She says its mostly when she moves suddenly or is lifting something.  On a pain scale of 1-10 it is still a 5.  She is Able to eat and drink.  Wants to know if she can go to work tomorrow. She is taking motrin but this is not helping. Informed her I will speak with Dr Juanda Chance and get back with her.  11:00 Spoke with Dr. Juanda Chance and following instructions given.  Stop taking motrin and just use tylenol for discomfort.  It is all right for her to work and continue with her daily activities.  If the pain worsens and changes in any way to go to ER.  Called in a prescription for carafate slurry.  11:03 Called pt back with these instructions and pt verbally stated them back to me and stated she understood theme.  Instructed her to call back or go to ER if she gets worse

## 2010-11-15 NOTE — Telephone Encounter (Signed)
Error

## 2010-11-20 ENCOUNTER — Encounter: Payer: Self-pay | Admitting: Internal Medicine

## 2010-11-22 ENCOUNTER — Telehealth: Payer: Self-pay | Admitting: Internal Medicine

## 2010-11-22 NOTE — Telephone Encounter (Signed)
Read patient the letter sent by Dr. Juanda Chance. Scheduled patient on 12/07/10 at 3:00 PM for OV with Dr. Juanda Chance.

## 2010-12-07 ENCOUNTER — Encounter: Payer: Self-pay | Admitting: Internal Medicine

## 2010-12-07 ENCOUNTER — Ambulatory Visit (INDEPENDENT_AMBULATORY_CARE_PROVIDER_SITE_OTHER): Admitting: Internal Medicine

## 2010-12-07 ENCOUNTER — Ambulatory Visit: Admitting: Internal Medicine

## 2010-12-07 VITALS — BP 118/64 | HR 80 | Ht 64.0 in | Wt 166.0 lb

## 2010-12-07 DIAGNOSIS — H6690 Otitis media, unspecified, unspecified ear: Secondary | ICD-10-CM

## 2010-12-07 DIAGNOSIS — F988 Other specified behavioral and emotional disorders with onset usually occurring in childhood and adolescence: Secondary | ICD-10-CM

## 2010-12-07 MED ORDER — LISDEXAMFETAMINE DIMESYLATE 60 MG PO CAPS: 60.0000 mg | ORAL_CAPSULE | ORAL | Status: DC

## 2010-12-07 MED ORDER — LEVOFLOXACIN 500 MG PO TABS: 500.0000 mg | ORAL_TABLET | Freq: Every day | ORAL | Status: DC

## 2010-12-07 NOTE — Progress Notes (Signed)
Subjective:    Patient ID: Connie Murillo, female    DOB: [birth year], 25 y.o.   MRN: [id number]  HPI  25 year old white female for urgent care followup. She was recently seen for otitis media and otitis externa of the left ear. She was started on amoxicillin 875 mg twice daily and eardrops. Patient reports she's still having some left ear pain but now also experiencing right ear pain. She denies any hearing loss.    Attention deficit disorder-patient has been stable her vyvance.  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 She plans to attend graduate school and hopefully law school. She denies insomnia, behavioral changes, or significant unintentional weight change  Review of Systems Negative for fever or chills  Past Medical History  Diagnosis Date  . ADD (attention deficit disorder)   . GERD (gastroesophageal reflux disease)   . Esophageal stricture     History   Social History  . Marital Status: Single    Spouse Name: N/A    Number of Children: 0  . Years of Education: N/A   Occupational History  . Student    Social History Main Topics  . Smoking status: Never Smoker   . Smokeless tobacco: Never Used  . Alcohol Use: 0.0 oz/week     one a day  . Drug Use: No  . Sexually Active: Not on file   Other Topics Concern  . 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 No past surgeries     denies surgical history  . Wisdom tooth extraction     Family History  Problem Relation Age of Onset  . Asthma Father   . Asthma Brother   . Other Neg Hx     No FH of thyroid d/o  . Colon cancer Neg Hx   . Irritable bowel syndrome Mother     No Known Allergies  Current Outpatient Prescriptions on File Prior to Visit  Medication Sig Dispense Refill  . drospirenone-ethinyl estradiol (YAZ) 3-0.02 MG per tablet Take 1 tablet by mouth daily.         Marland Kitchen esomeprazole (NEXIUM) 40 MG capsule Take 1 capsule (40 mg total) by mouth daily.  30 capsule  11  . LORazepam (ATIVAN) 0.5 MG tablet Take one every 8 hours prior to procedure. May take the first one the night before the procedure.  3 tablet  0  . Multiple Vitamin (MULTIVITAMIN) tablet Take 1 tablet by mouth daily.          BP 104/80  Pulse 64  Temp(Src) 98 F (36.7 C) (Oral)  Wt 168 lb (76.204 kg)  LMP 11/09/2010       Objective:   Physical Exam   Constitutional: Appears well-developed and well-nourished. No distress.  Head: Normocephalic and atraumatic.  Right Ear: Right external auditory canal is swollen and erythematous ,  TM is dull Left Ear: Left external auditory canal is mildly swollen,  TM is dull Mouth/Throat: Oropharynx is clear and moist.  Eyes: Conjunctivae are normal. Pupils are equal, round, and reactive to light.  Neck: Normal range of motion. Neck supple. No thyromegaly present. No carotid bruit Cardiovascular: Normal rate, regular rhythm and normal heart sounds.  Exam reveals no gallop and no friction rub.   No murmur heard. Pulmonary/Chest: Effort normal and breath sounds normal.  No wheezes. No rales.  Skin: Skin is warm and dry.  Psychiatric: Normal mood and affect. Behavior is normal.         Assessment & Plan:

## 2010-12-07 NOTE — Assessment & Plan Note (Signed)
-  Stable. Continue current medication regimen. 

## 2010-12-07 NOTE — Assessment & Plan Note (Signed)
Patient appears to have bilateral otitis externa and otitis media.  Presumed treatment failure with amoxicillin.  Switch to Levaquin 500 mg daily x10 days.  Finish eardrops as previously prescribed by urgent care. Please call our office if your symptoms do not improve or gets worse.

## 2010-12-07 NOTE — Progress Notes (Signed)
Connie Murillo 06/26/1985 MRN [id number]    History of Present Illness:  This is a 25 year old white female who is here for a followup appointment after she was evaluated for severe dysphagia and odynophagia. She has done well since her upper endoscopy and dilatation on 11/13/2010. A 52 French Maloney dilator passed without resistance. There was no evidence of esophageal stricture. 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 She has been on Nexium 40 mg a day. He currently denies any dysphagia to solids or liquids or any chest pain. She has a seasonal allergies but no history of asthma.   Past Medical History  Diagnosis Date  . ADD (attention deficit disorder)   . GERD (gastroesophageal reflux disease)   . Esophageal stricture    Past Surgical History  Procedure Date  . No past surgeries     denies surgical history  . Wisdom tooth extraction     reports that she has never smoked. She has never used smokeless tobacco. She reports that she drinks alcohol. She reports that she does not use illicit drugs. family history includes Asthma in her brother and father and Irritable bowel syndrome in her mother.  There is no history of Other and Colon cancer. No Known Allergies      Review of Systems: Denies chest pain abdominal pain with changes of swallowing  The remainder of the 10  point ROS is negative except as outlined in H&P    Assessment and Plan:  Problem #1 The solution of dysphagia and odynophagia is likely attributed to empirical dilatation and use of proton pump inhibitors. She is asymptomatic. Since she is doing well, we will not use the standard treatment with Flovent inhaler but if she becomes symptomatic again, I would consider using topical steroids for esophagitis. She will stay on Nexium 40 mg a day for a short period of time and then will try to stop it. She has a prescription for 1  year.   12/07/2010 [name]

## 2011-03-28 ENCOUNTER — Telehealth: Payer: Self-pay | Admitting: Internal Medicine

## 2011-03-28 NOTE — Telephone Encounter (Signed)
Pts mom called and said that pt is sick with sorethroat,earache,vomiting,constipation,low grade fever. Pt req call back from nurse asap.

## 2011-03-28 NOTE — Telephone Encounter (Signed)
Pt is going to urgent care cause we don't have anything available

## 2011-04-05 ENCOUNTER — Encounter: Payer: Self-pay | Admitting: Internal Medicine

## 2011-04-05 ENCOUNTER — Ambulatory Visit (INDEPENDENT_AMBULATORY_CARE_PROVIDER_SITE_OTHER): Payer: BC Managed Care – PPO | Admitting: Internal Medicine

## 2011-04-05 DIAGNOSIS — F988 Other specified behavioral and emotional disorders with onset usually occurring in childhood and adolescence: Secondary | ICD-10-CM

## 2011-04-05 DIAGNOSIS — J069 Acute upper respiratory infection, unspecified: Secondary | ICD-10-CM | POA: Insufficient documentation

## 2011-04-05 MED ORDER — LISDEXAMFETAMINE DIMESYLATE 60 MG PO CAPS: 60.0000 mg | ORAL_CAPSULE | ORAL | Status: DC

## 2011-04-05 NOTE — Progress Notes (Signed)
  Subjective:    Patient ID: Connie Murillo, female    DOB: Mar 08, 1986, 26 y.o.   MRN: [id number]  URI  This is a new problem. The current episode started in the past 7 days. There has been no fever. Associated symptoms include congestion, coughing, a plugged ear sensation and a sore throat. Pertinent negatives include no wheezing. She has tried decongestant for the symptoms. The treatment provided no relief.   ADD - stable. 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   Review of Systems  HENT: Positive for congestion and sore throat.   Respiratory: Positive for cough. Negative for wheezing.    Past Medical History  Diagnosis Date  . ADD (attention deficit disorder)   . GERD (gastroesophageal reflux disease)   . Esophageal stricture     History   Social History  . Marital Status: Single    Spouse Name: N/A    Number of Children: 0  . Years of Education: N/A   Occupational History  . Student    Social History Main Topics  . Smoking status: Never Smoker   . Smokeless tobacco: Never Used  . Alcohol Use: 0.0 oz/week     one a day  . Drug Use: No  . Sexually Active: Not on file   Other Topics Concern  . 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 No past surgeries     denies surgical history  . Wisdom tooth extraction     Family History  Problem Relation Age of Onset  . Asthma Father   . Asthma Brother   . Other Neg Hx     No FH of thyroid d/o  . Colon cancer Neg Hx   . Irritable bowel syndrome Mother     No Known Allergies  Current Outpatient Prescriptions on File Prior to Visit  Medication Sig Dispense Refill  . drospirenone-ethinyl estradiol (YAZ) 3-0.02 MG per tablet Take 1 tablet by mouth daily.        Marland Kitchen esomeprazole (NEXIUM) 40 MG capsule Take 1 capsule (40 mg total) by mouth daily.  30 capsule  11  . Multiple  Vitamin (MULTIVITAMIN) tablet Take 1 tablet by mouth daily.          BP 112/74  Pulse 88  Temp(Src) 99 F (37.2 C) (Oral)  Ht 5' 4.5" (1.638 m)  Wt 154 lb (69.854 kg)  BMI 26.03 kg/m2       Objective:   Physical Exam  Constitutional: She appears well-developed and well-nourished.  HENT:  Head: Normocephalic and atraumatic.  Right Ear: External ear normal.  Left Ear: External ear normal.  Neck: Normal range of motion. Neck supple.  Cardiovascular: Normal rate, regular rhythm and normal heart sounds.   Pulmonary/Chest: Effort normal and breath sounds normal. No respiratory distress. She has no wheezes.  Lymphadenopathy:    She has no cervical adenopathy.  Skin: Skin is warm and dry.  Psychiatric: Her behavior is normal.       Assessment & Plan:

## 2011-04-05 NOTE — Assessment & Plan Note (Signed)
26 year old white female with symptoms of viral URI. We reviewed symptomatic treatment. Patient advised to call office if symptoms worsen.

## 2011-04-05 NOTE — Assessment & Plan Note (Signed)
Stable.  Continue current dose of vyvanse.  She is currently completing internship with local judge.

## 2011-04-05 NOTE — Patient Instructions (Signed)
Use nasal saline rinse as directed Gargle with warm salt water  Use pseudoephedrine 12 hrs as needed Use mucinex twice daily Please call our office if your symptoms do not improve or gets worse.

## 2011-04-09 ENCOUNTER — Telehealth: Payer: Self-pay | Admitting: Internal Medicine

## 2011-04-09 NOTE — Telephone Encounter (Signed)
Pt called back to check on status of getting an abx cough, head and chest congestion. To [facility] [phone]

## 2011-04-09 NOTE — Telephone Encounter (Signed)
Pt was seen on 04-05-11 for uri. Pt mom stated doc said if pt no better will call in abx. [facility] [phone]

## 2011-04-10 ENCOUNTER — Other Ambulatory Visit: Payer: Self-pay | Admitting: Internal Medicine

## 2011-04-10 MED ORDER — AZITHROMYCIN 250 MG PO TABS: ORAL_TABLET | ORAL | Status: AC

## 2011-04-10 NOTE — Telephone Encounter (Signed)
rx sent in electronically 

## 2011-04-10 NOTE — Telephone Encounter (Signed)
Call in azithromycin 250 mg # 6 - take 2 tabs daily for 3 days

## 2011-06-17 ENCOUNTER — Ambulatory Visit: Payer: BC Managed Care – PPO | Admitting: Family Medicine

## 2011-06-19 ENCOUNTER — Encounter: Payer: Self-pay | Admitting: Internal Medicine

## 2011-06-19 ENCOUNTER — Ambulatory Visit (INDEPENDENT_AMBULATORY_CARE_PROVIDER_SITE_OTHER): Payer: BC Managed Care – PPO | Admitting: Internal Medicine

## 2011-06-19 DIAGNOSIS — F988 Other specified behavioral and emotional disorders with onset usually occurring in childhood and adolescence: Secondary | ICD-10-CM

## 2011-06-19 DIAGNOSIS — L659 Nonscarring hair loss, unspecified: Secondary | ICD-10-CM | POA: Insufficient documentation

## 2011-06-19 LAB — FERRITIN: Ferritin: 16 ng/mL (ref 10.0–291.0)

## 2011-06-19 MED ORDER — LISDEXAMFETAMINE DIMESYLATE 60 MG PO CAPS: 60.0000 mg | ORAL_CAPSULE | ORAL | Status: DC

## 2011-06-19 NOTE — Progress Notes (Signed)
  Subjective:    Patient ID: Connie Murillo, female    DOB: 22-Jun-1985, 26 y.o.   MRN: [id number]  HPI  26 year old white female with history of ADD complains of generalized alopecia. She has noticed since the last one to 2 weeks. Her hairstylist also noticed generalized hair loss. She gets her hair colored every 6 weeks. She denies any symptoms of underactive thyroid. No constipation, no unusual fatigue, or cold intolerance.  ADD-stable. Patient is currently working as an Tax inspector with local judge and is hoping to attend law school in the fall.   Review of Systems Negative for weight changes  Past Medical History  Diagnosis Date  . ADD (attention deficit disorder)   . GERD (gastroesophageal reflux disease)   . Esophageal stricture     History   Social History  . Marital Status: Single    Spouse Name: N/A    Number of Children: 0  . Years of Education: N/A   Occupational History  . Student    Social History Main Topics  . Smoking status: Never Smoker   . Smokeless tobacco: Never Used  . Alcohol Use: 0.0 oz/week     one a day  . Drug Use: No  . Sexually Active: Not on file   Other Topics Concern  . Not on file   Social History Narrative   SingleSenior - [employer] in [place] (Diet[employer] Smoked       Past Surgical History  Procedure Date  . No past surgeries     denies surgical history  . Wisdom tooth extraction     Family History  Problem Relation Age of Onset  . Asthma Father   . Asthma Brother   . Other Neg Hx     No FH of thyroid d/o  . Colon cancer Neg Hx   . Irritable bowel syndrome Mother     No Known Allergies  Current Outpatient Prescriptions on File Prior to Visit  Medication Sig Dispense Refill  . drospirenone-ethinyl estradiol (YAZ) 3-0.02 MG per tablet Take 1 tablet by mouth daily.        Marland Kitchen esomeprazole (NEXIUM) 40 MG capsule Take 1 capsule (40 mg total) by mouth daily.  30 capsule  11  . Multiple Vitamin (MULTIVITAMIN) tablet  Take 1 tablet by mouth daily.          BP 114/80  Temp(Src) 97.7 F (36.5 C) (Oral)  Wt 155 lb (70.308 kg)       Objective:   Physical Exam  Constitutional: She appears well-developed and well-nourished.  HENT:       Mild generalized alopecia  Neck: Normal range of motion. Neck supple. No thyromegaly present.  Cardiovascular: Normal rate and regular rhythm.   Pulmonary/Chest: Effort normal and breath sounds normal. She has no wheezes. She has no rales.  Skin: Skin is warm and dry.      Assessment & Plan:

## 2011-06-19 NOTE — Assessment & Plan Note (Signed)
26 year old white female with mild generalized alopecia. Unclear whether her symptoms are due to getting her hair colored every 6 weeks. Check iron levels and rule out hypothyroidism.

## 2011-06-19 NOTE — Assessment & Plan Note (Signed)
-  Stable. Continue current medication regimen. 

## 2011-06-19 NOTE — Patient Instructions (Addendum)
Our office will contact you re: blood test results 

## 2011-07-17 ENCOUNTER — Telehealth: Payer: Self-pay | Admitting: Internal Medicine

## 2011-07-17 NOTE — Telephone Encounter (Signed)
Patient called stating that she continues to have hair loss and would like to know if maybe the MD could call her something in to take for it. Please advise.

## 2011-07-18 NOTE — Telephone Encounter (Signed)
I suggest referral to dermatologist.  plz place order for referral to Dr. Terri Piedra re: alopecia

## 2011-07-19 NOTE — Telephone Encounter (Signed)
Referral order placed.

## 2011-07-19 NOTE — Telephone Encounter (Signed)
Addended by: Alfred Levins D on: 07/19/2011 09:45 AM   Modules accepted: Orders

## 2011-10-22 ENCOUNTER — Telehealth: Payer: Self-pay | Admitting: Internal Medicine

## 2011-10-22 MED ORDER — LISDEXAMFETAMINE DIMESYLATE 60 MG PO CAPS: 60.0000 mg | ORAL_CAPSULE | ORAL | Status: AC

## 2011-10-22 NOTE — Telephone Encounter (Signed)
Pt aware, will call back to schedule OV, mailed rx to pt

## 2011-10-22 NOTE — Telephone Encounter (Signed)
Pt called and is vacationing in [place] and will not be returning until sometime in September. Pt is needing to get a refill of lisdexamfetamine (VYVANSE) 60 MG capsule. Pts Address is [address], [place], [place] [address]. Pls call pt asap.

## 2011-10-22 NOTE — Telephone Encounter (Signed)
Ok to send rx for 1 month supply only.  She needs OV for further refills.

## 2014-11-21 ENCOUNTER — Telehealth: Payer: Self-pay | Admitting: Internal Medicine

## 2019-06-16 ENCOUNTER — Other Ambulatory Visit: Payer: Self-pay

## 2019-06-16 ENCOUNTER — Ambulatory Visit: Payer: BC Managed Care – PPO | Admitting: Orthopaedic Surgery

## 2019-06-16 ENCOUNTER — Encounter: Payer: Self-pay | Admitting: Orthopaedic Surgery

## 2019-06-16 DIAGNOSIS — M545 Low back pain, unspecified: Secondary | ICD-10-CM

## 2019-06-16 DIAGNOSIS — M549 Dorsalgia, unspecified: Secondary | ICD-10-CM | POA: Diagnosis not present

## 2019-06-16 MED ORDER — METHYLPREDNISOLONE 4 MG PO TABS: ORAL_TABLET | ORAL | 0 refills | Status: AC

## 2019-06-16 MED ORDER — CYCLOBENZAPRINE HCL 10 MG PO TABS: 10.0000 mg | ORAL_TABLET | Freq: Two times a day (BID) | ORAL | 0 refills | Status: AC | PRN

## 2019-06-16 MED ORDER — ACETAMINOPHEN-CODEINE #3 300-30 MG PO TABS: 1.0000 | ORAL_TABLET | Freq: Three times a day (TID) | ORAL | 0 refills | Status: AC | PRN
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
## 2019-06-21 ENCOUNTER — Other Ambulatory Visit: Payer: Self-pay

## 2019-06-21 ENCOUNTER — Ambulatory Visit: Payer: BC Managed Care – PPO | Attending: Orthopaedic Surgery | Admitting: Physical Therapy

## 2019-06-21 DIAGNOSIS — M545 Low back pain, unspecified: Secondary | ICD-10-CM

## 2019-06-21 DIAGNOSIS — M542 Cervicalgia: Secondary | ICD-10-CM | POA: Insufficient documentation

## 2019-06-21 DIAGNOSIS — M546 Pain in thoracic spine: Secondary | ICD-10-CM | POA: Diagnosis present
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
## 2019-06-24 ENCOUNTER — Ambulatory Visit: Payer: BC Managed Care – PPO | Admitting: Physical Therapy

## 2019-06-24 ENCOUNTER — Other Ambulatory Visit: Payer: Self-pay

## 2019-06-24 ENCOUNTER — Encounter: Payer: Self-pay | Admitting: Physical Therapy

## 2019-06-24 DIAGNOSIS — M545 Low back pain, unspecified: Secondary | ICD-10-CM

## 2019-06-24 DIAGNOSIS — M546 Pain in thoracic spine: Secondary | ICD-10-CM | POA: Diagnosis not present

## 2019-06-24 DIAGNOSIS — M542 Cervicalgia: Secondary | ICD-10-CM
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
## 2019-06-29 ENCOUNTER — Encounter: Payer: Self-pay | Admitting: Orthopaedic Surgery

## 2019-06-29 ENCOUNTER — Ambulatory Visit: Payer: BC Managed Care – PPO | Admitting: Physical Therapy

## 2019-06-29 ENCOUNTER — Encounter: Payer: Self-pay | Admitting: Physical Therapy

## 2019-06-29 ENCOUNTER — Other Ambulatory Visit: Payer: Self-pay

## 2019-06-29 DIAGNOSIS — M545 Low back pain, unspecified: Secondary | ICD-10-CM

## 2019-06-29 DIAGNOSIS — M546 Pain in thoracic spine: Secondary | ICD-10-CM | POA: Diagnosis not present

## 2019-06-29 DIAGNOSIS — M542 Cervicalgia: Secondary | ICD-10-CM

## 2019-06-29 NOTE — Telephone Encounter (Signed)
Lets just have her come in for a visit
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
## 2019-06-30 ENCOUNTER — Telehealth: Payer: Self-pay | Admitting: Orthopaedic Surgery

## 2019-06-30 NOTE — Telephone Encounter (Signed)
Patient called.   Gave her an appointment with [facility], who was next available, in accordance to the messages from [employer].

## 2019-07-01 ENCOUNTER — Ambulatory Visit: Payer: BC Managed Care – PPO | Attending: Orthopaedic Surgery | Admitting: Physical Therapy

## 2019-07-01 ENCOUNTER — Other Ambulatory Visit: Payer: Self-pay

## 2019-07-01 ENCOUNTER — Encounter: Payer: Self-pay | Admitting: Physical Therapy

## 2019-07-01 DIAGNOSIS — M546 Pain in thoracic spine: Secondary | ICD-10-CM

## 2019-07-01 DIAGNOSIS — M545 Low back pain, unspecified: Secondary | ICD-10-CM

## 2019-07-01 DIAGNOSIS — M542 Cervicalgia: Secondary | ICD-10-CM

## 2019-07-01 NOTE — Patient Instructions (Signed)
[facility]  DRY NEEDLING CONSENT FORM   Trigger point dry needling is a physical therapy approach to treat Myofascial Pain and Dysfunction.  Dry Needling (DN) is a valuable and effective way to deactivate myofascial trigger points (muscle knots). It is skilled intervention that uses a thin filiform needle to penetrate the skin and stimulate underlying myofascial trigger points, muscular, and connective tissues for the management of neuromusculoskeletal pain and movement impairments.  A local twitch response (LTR) will be elicited.  This can sometimes feel like a deep ache in the muscle during the procedure. Multiple trigger points in multiple muscles can be treated during each treatment.  No medication of any kind is injected.   As with any medical treatment and procedure, there are possible adverse events.  While significant adverse events are uncommon, they do sometimes occur and must be considered prior to giving consent.  1. Dry needling often causes a "post needling soreness".  There can be an increase in pain from a couple of hours to 2-3 days, followed by an improvement in the overall pain state. 2. Any time a needle is used there is a risk of infection.  However, we are using new, sterile, and disposable needles; infections are extremely rare. 3. There is a possibility that you may bleed or bruise.  You may feel tired and some nausea following treatment. 4. There is a rare possibility of a pneumothorax (air in the chest cavity). 5. Allergic reaction to nickel in the stainless steel needle. 6. If a nerve is touched, it may cause paresthesia (a prickling/shock sensation) which is usually brief, but may continue for a couple of days.  Following treatment stay hydrated.  Continue regular activities but not too vigorous initially after treatment for 24-48 hours. You may apply heat to sore muscles.  Dry Needling is best when combined with other physical therapy  interventions such as strengthening, stretching and other therapeutic modalities.     PLEASE ANSWER THE FOLLOWING QUESTIONS:  Do you have a lack of sensation?   Y/N  Do you have a phobia or fear of needles  Y/N  Are you pregnant?    Y/N If yes:  How many weeks? _____  Do you have any implanted devices?  Y/N If yes:  Pacemaker/Spinal Cord         Stimulator/Deep Brain         Stimulator/Insulin          Pump/Other: ____________ Do you have any implants?   Y/N If yes:      Do you take any blood thinners?   Y/N If yes: Coumadin          (Warfarin)/Other:  Do you have a bleeding disorder?   Y/N If yes: What kind:   Do you take any immunosuppressants?  Y/N If yes:   What kind:   Do you take anti-inflammatories?   Y/N If yes: What kind:  Have you ever been diagnosed with Scoliosis? Y/N  Have you had back surgery?    Y/N If yes:         Laminectomy/Fusion/Other:   I have read, or had read to me, the above.  I have had the opportunity to ask any questions.  All of my questions have been answered to my satisfaction and I understand the risks involved with dry needling.  I consent to examination and treatment at [facility], including dry needling, of any and all of my involved and affected   muscles.   
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
## 2019-07-02 ENCOUNTER — Ambulatory Visit: Payer: BC Managed Care – PPO

## 2019-07-05 ENCOUNTER — Ambulatory Visit: Payer: BC Managed Care – PPO | Admitting: Family Medicine

## 2019-07-06 ENCOUNTER — Encounter: Payer: Self-pay | Admitting: Physical Therapy

## 2019-07-06 ENCOUNTER — Other Ambulatory Visit: Payer: Self-pay

## 2019-07-06 ENCOUNTER — Ambulatory Visit: Payer: BC Managed Care – PPO | Admitting: Physical Therapy

## 2019-07-06 DIAGNOSIS — M545 Low back pain, unspecified: Secondary | ICD-10-CM

## 2019-07-06 DIAGNOSIS — M546 Pain in thoracic spine: Secondary | ICD-10-CM | POA: Diagnosis not present

## 2019-07-06 DIAGNOSIS — M542 Cervicalgia: Secondary | ICD-10-CM
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
## 2019-07-07 ENCOUNTER — Ambulatory Visit (INDEPENDENT_AMBULATORY_CARE_PROVIDER_SITE_OTHER): Payer: BC Managed Care – PPO | Admitting: Orthopaedic Surgery

## 2019-07-07 ENCOUNTER — Other Ambulatory Visit: Payer: Self-pay

## 2019-07-07 ENCOUNTER — Encounter: Payer: Self-pay | Admitting: Orthopaedic Surgery

## 2019-07-07 DIAGNOSIS — M545 Low back pain, unspecified: Secondary | ICD-10-CM

## 2019-07-07 DIAGNOSIS — M549 Dorsalgia, unspecified: Secondary | ICD-10-CM | POA: Diagnosis not present

## 2019-07-07 DIAGNOSIS — M4807 Spinal stenosis, lumbosacral region: Secondary | ICD-10-CM

## 2019-07-07 MED ORDER — ONDANSETRON 4 MG PO TBDP: 4.0000 mg | ORAL_TABLET | Freq: Three times a day (TID) | ORAL | 0 refills | Status: AC | PRN

## 2019-07-07 MED ORDER — HYDROCODONE-ACETAMINOPHEN 5-325 MG PO TABS: 1.0000 | ORAL_TABLET | Freq: Three times a day (TID) | ORAL | 0 refills | Status: AC | PRN
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
## 2019-07-08 ENCOUNTER — Ambulatory Visit: Payer: BC Managed Care – PPO | Attending: Internal Medicine

## 2019-07-08 ENCOUNTER — Ambulatory Visit: Payer: BC Managed Care – PPO | Admitting: Physical Therapy

## 2019-07-08 DIAGNOSIS — Z23 Encounter for immunization: Secondary | ICD-10-CM

## 2019-07-08 NOTE — Progress Notes (Signed)
   Covid-19 Vaccination Clinic  Name:  Connie Murillo    MRN: [id number] DOB: April 11, 1985  07/08/2019  [name] was observed post Covid-19 immunization for 15 minutes without incident. She was provided with Vaccine Information Sheet and instruction to access the V-Safe system.   [name] was instructed to call 911 with any severe reactions post vaccine: Marland Kitchen Difficulty breathing  . Swelling of face and throat  . A fast heartbeat  . A bad rash all over body  . Dizziness and weakness   Immunizations Administered    Name Date Dose VIS Date Route   Pfizer COVID-19 Vaccine 07/08/2019  1:57 PM 0.3 mL 03/12/2019 Intramuscular   Manufacturer: [employer], [employer]   Lot: [id number]   NDC: [address]-9

## 2019-07-12 ENCOUNTER — Telehealth: Payer: Self-pay | Admitting: Orthopaedic Surgery

## 2019-07-12 ENCOUNTER — Other Ambulatory Visit: Payer: Self-pay

## 2019-07-12 ENCOUNTER — Encounter: Payer: Self-pay | Admitting: Physical Therapy

## 2019-07-12 ENCOUNTER — Ambulatory Visit: Payer: BC Managed Care – PPO | Admitting: Physical Therapy

## 2019-07-12 DIAGNOSIS — M546 Pain in thoracic spine: Secondary | ICD-10-CM

## 2019-07-12 DIAGNOSIS — M542 Cervicalgia: Secondary | ICD-10-CM

## 2019-07-12 DIAGNOSIS — M545 Low back pain, unspecified: Secondary | ICD-10-CM

## 2019-07-12 MED ORDER — DIAZEPAM 5 MG PO TABS: ORAL_TABLET | ORAL | 0 refills | Status: AC
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
## 2019-07-12 NOTE — Telephone Encounter (Signed)
Patient aware this was sent in for her  

## 2019-07-12 NOTE — Telephone Encounter (Signed)
Spoke with patient she asked if she can get something to relax her during the MRI? The number to contact patient is [phone]

## 2019-07-15 ENCOUNTER — Other Ambulatory Visit: Payer: Self-pay

## 2019-07-15 ENCOUNTER — Encounter: Payer: Self-pay | Admitting: Physical Therapy

## 2019-07-15 ENCOUNTER — Ambulatory Visit: Payer: BC Managed Care – PPO | Admitting: Physical Therapy

## 2019-07-15 DIAGNOSIS — M545 Low back pain, unspecified: Secondary | ICD-10-CM

## 2019-07-15 DIAGNOSIS — M546 Pain in thoracic spine: Secondary | ICD-10-CM

## 2019-07-15 DIAGNOSIS — M542 Cervicalgia: Secondary | ICD-10-CM
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
## 2019-07-20 ENCOUNTER — Ambulatory Visit: Payer: BC Managed Care – PPO | Admitting: Physical Therapy

## 2019-07-20 ENCOUNTER — Encounter: Payer: Self-pay | Admitting: Orthopaedic Surgery

## 2019-07-21 ENCOUNTER — Ambulatory Visit: Payer: BC Managed Care – PPO | Admitting: Physical Therapy

## 2019-07-22 ENCOUNTER — Ambulatory Visit: Payer: BC Managed Care – PPO | Admitting: Physical Therapy

## 2019-07-22 ENCOUNTER — Encounter: Payer: Self-pay | Admitting: Physical Therapy

## 2019-07-22 ENCOUNTER — Other Ambulatory Visit: Payer: Self-pay

## 2019-07-22 DIAGNOSIS — M546 Pain in thoracic spine: Secondary | ICD-10-CM | POA: Diagnosis not present

## 2019-07-22 DIAGNOSIS — M545 Low back pain, unspecified: Secondary | ICD-10-CM

## 2019-07-22 DIAGNOSIS — M542 Cervicalgia: Secondary | ICD-10-CM
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
## 2019-07-27 ENCOUNTER — Ambulatory Visit: Payer: BC Managed Care – PPO | Admitting: *Deleted

## 2019-07-28 ENCOUNTER — Other Ambulatory Visit: Payer: Self-pay

## 2019-07-28 ENCOUNTER — Ambulatory Visit: Payer: BC Managed Care – PPO | Admitting: Physical Therapy

## 2019-07-28 DIAGNOSIS — M545 Low back pain, unspecified: Secondary | ICD-10-CM

## 2019-07-28 DIAGNOSIS — M542 Cervicalgia: Secondary | ICD-10-CM

## 2019-07-28 DIAGNOSIS — M546 Pain in thoracic spine: Secondary | ICD-10-CM | POA: Diagnosis not present
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
## 2019-07-30 ENCOUNTER — Ambulatory Visit: Payer: BC Managed Care – PPO | Admitting: Physical Therapy

## 2019-07-30 ENCOUNTER — Encounter: Payer: Self-pay | Admitting: Physical Therapy

## 2019-07-30 ENCOUNTER — Other Ambulatory Visit: Payer: Self-pay

## 2019-07-30 DIAGNOSIS — M546 Pain in thoracic spine: Secondary | ICD-10-CM

## 2019-07-30 DIAGNOSIS — M545 Low back pain, unspecified: Secondary | ICD-10-CM

## 2019-07-30 DIAGNOSIS — M542 Cervicalgia: Secondary | ICD-10-CM
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
## 2019-08-02 ENCOUNTER — Ambulatory Visit: Payer: BC Managed Care – PPO | Attending: Internal Medicine

## 2019-08-02 DIAGNOSIS — Z23 Encounter for immunization: Secondary | ICD-10-CM

## 2019-08-02 NOTE — Progress Notes (Signed)
   Covid-19 Vaccination Clinic  Name:  Connie Murillo    MRN: [id number] DOB: [birth year]  08/02/2019  [name] was observed post Covid-19 immunization for 15 minutes without incident. She was provided with Vaccine Information Sheet and instruction to access the V-Safe system.   [name] was instructed to call 911 with any severe reactions post vaccine: Marland Kitchen Difficulty breathing  . Swelling of face and throat  . A fast heartbeat  . A bad rash all over body  . Dizziness and weakness   Immunizations Administered    Name Date Dose VIS Date Route   Pfizer COVID-19 Vaccine 08/02/2019  1:16 PM 0.3 mL 05/26/2018 Intramuscular   Manufacturer: [employer], [employer]   Lot: [id number]   NDC: [address]-1

## 2019-08-05 ENCOUNTER — Other Ambulatory Visit: Payer: Self-pay

## 2019-08-05 ENCOUNTER — Ambulatory Visit (HOSPITAL_COMMUNITY)
Admission: RE | Admit: 2019-08-05 | Discharge: 2019-08-05 | Disposition: A | Discharge status: Home / Self Care | Payer: BC Managed Care – PPO | Source: Ambulatory Visit | Attending: Orthopaedic Surgery | Admitting: Orthopaedic Surgery

## 2019-08-05 DIAGNOSIS — M4807 Spinal stenosis, lumbosacral region: Secondary | ICD-10-CM | POA: Diagnosis present

## 2019-08-06 ENCOUNTER — Encounter: Payer: BC Managed Care – PPO | Admitting: Physical Therapy

## 2019-08-09 ENCOUNTER — Encounter: Payer: Self-pay | Admitting: Orthopaedic Surgery

## 2019-08-09 ENCOUNTER — Other Ambulatory Visit: Payer: Self-pay

## 2019-08-09 ENCOUNTER — Ambulatory Visit (INDEPENDENT_AMBULATORY_CARE_PROVIDER_SITE_OTHER): Payer: BC Managed Care – PPO | Admitting: Orthopaedic Surgery

## 2019-08-09 DIAGNOSIS — M549 Dorsalgia, unspecified: Secondary | ICD-10-CM

## 2019-08-09 DIAGNOSIS — M545 Low back pain, unspecified: Secondary | ICD-10-CM
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
## 2019-08-31 ENCOUNTER — Encounter: Payer: Self-pay | Admitting: Orthopaedic Surgery

## 2019-09-01 ENCOUNTER — Other Ambulatory Visit: Payer: Self-pay

## 2019-09-01 DIAGNOSIS — M549 Dorsalgia, unspecified: Secondary | ICD-10-CM

## 2019-09-01 DIAGNOSIS — M4807 Spinal stenosis, lumbosacral region: Secondary | ICD-10-CM

## 2019-12-14 ENCOUNTER — Telehealth: Payer: Self-pay | Admitting: General Practice

## 2019-12-14 NOTE — Telephone Encounter (Signed)
LVM to inform patient of information below.  

## 2019-12-14 NOTE — Telephone Encounter (Signed)
Very sorry, am unable to accept at this time 

## 2019-12-14 NOTE — Telephone Encounter (Signed)
Patient is requesting to become a New Patient of yours. You see her mother Alexarae Oliva. The patient just moved here from [place].   Please advise if you accept. Thank you.

## 2020-04-18 ENCOUNTER — Ambulatory Visit: Payer: Self-pay | Admitting: Orthopaedic Surgery

## 2020-04-26 ENCOUNTER — Ambulatory Visit: Payer: Self-pay | Admitting: Orthopaedic Surgery

## 2020-05-08 ENCOUNTER — Ambulatory Visit: Payer: Self-pay | Admitting: Orthopaedic Surgery

## 2020-05-18 ENCOUNTER — Ambulatory Visit (INDEPENDENT_AMBULATORY_CARE_PROVIDER_SITE_OTHER): Payer: 59 | Admitting: Orthopaedic Surgery

## 2020-05-18 DIAGNOSIS — M545 Low back pain, unspecified: Secondary | ICD-10-CM

## 2020-05-18 DIAGNOSIS — G8929 Other chronic pain: Secondary | ICD-10-CM
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
## 2020-07-12 ENCOUNTER — Other Ambulatory Visit: Payer: Self-pay

## 2020-07-12 ENCOUNTER — Telehealth: Payer: Self-pay

## 2020-07-12 DIAGNOSIS — M4807 Spinal stenosis, lumbosacral region: Secondary | ICD-10-CM

## 2020-07-12 DIAGNOSIS — M549 Dorsalgia, unspecified: Secondary | ICD-10-CM

## 2020-07-12 DIAGNOSIS — G8929 Other chronic pain: Secondary | ICD-10-CM

## 2020-07-12 NOTE — Telephone Encounter (Signed)
Order changed.

## 2020-07-12 NOTE — Telephone Encounter (Signed)
Can you change that order to [facility] instead of [place] for me?

## 2020-07-12 NOTE — Telephone Encounter (Signed)
Pt called and asked if she can go to Physical Therapy instead of going to a chiropractor
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
## 2020-12-29 ENCOUNTER — Encounter: Payer: Self-pay | Admitting: *Deleted

## 2024-03-18 ENCOUNTER — Emergency Department (HOSPITAL_BASED_OUTPATIENT_CLINIC_OR_DEPARTMENT_OTHER)
Admission: EM | Admit: 2024-03-18 | Discharge: 2024-03-18 | Disposition: A | Discharge status: Home / Self Care | Source: Home / Self Care | Attending: Emergency Medicine | Admitting: Emergency Medicine

## 2024-03-18 ENCOUNTER — Other Ambulatory Visit: Payer: Self-pay

## 2024-03-18 ENCOUNTER — Encounter (HOSPITAL_BASED_OUTPATIENT_CLINIC_OR_DEPARTMENT_OTHER): Payer: Self-pay

## 2024-03-18 DIAGNOSIS — R197 Diarrhea, unspecified: Secondary | ICD-10-CM | POA: Diagnosis not present

## 2024-03-18 DIAGNOSIS — R112 Nausea with vomiting, unspecified: Secondary | ICD-10-CM | POA: Diagnosis present

## 2024-03-18 DIAGNOSIS — D72829 Elevated white blood cell count, unspecified: Secondary | ICD-10-CM | POA: Insufficient documentation

## 2024-03-18 DIAGNOSIS — E860 Dehydration: Secondary | ICD-10-CM | POA: Insufficient documentation

## 2024-03-18 DIAGNOSIS — R000 Tachycardia, unspecified: Secondary | ICD-10-CM | POA: Diagnosis not present

## 2024-03-18 LAB — URINALYSIS, ROUTINE W REFLEX MICROSCOPIC
Bilirubin Urine: NEGATIVE
Glucose, UA: NEGATIVE mg/dL
Hgb urine dipstick: NEGATIVE
Ketones, ur: NEGATIVE mg/dL
Leukocytes,Ua: NEGATIVE
Nitrite: NEGATIVE
Protein, ur: 30 mg/dL — AB
Specific Gravity, Urine: >=1.03 (ref 1.005–1.030)
pH: 5.5 (ref 5.0–8.0)

## 2024-03-18 LAB — COMPREHENSIVE METABOLIC PANEL WITH GFR
ALT: 25 U/L (ref 0–44)
AST: 19 U/L (ref 15–41)
Albumin: 4.5 g/dL (ref 3.5–5.0)
Alkaline Phosphatase: 62 U/L (ref 38–126)
Anion gap: 15 (ref 5–15)
BUN: 16 mg/dL (ref 6–20)
CO2: 22 mmol/L (ref 22–32)
Calcium: 8.7 mg/dL — ABNORMAL LOW (ref 8.9–10.3)
Chloride: 102 mmol/L (ref 98–111)
Creatinine, Ser: 0.57 mg/dL (ref 0.44–1.00)
GFR, Estimated: >60 mL/min (ref >60)
Glucose, Bld: 148 mg/dL — ABNORMAL HIGH (ref 70–99)
Potassium: 3.8 mmol/L (ref 3.5–5.1)
Sodium: 138 mmol/L (ref 135–145)
Total Bilirubin: 0.2 mg/dL (ref 0.0–1.2)
Total Protein: 7.8 g/dL (ref 6.5–8.1)

## 2024-03-18 LAB — CBC
HCT: 42.1 % (ref 36.0–46.0)
Hemoglobin: 13.8 g/dL (ref 12.0–15.0)
MCH: 27.8 pg (ref 26.0–34.0)
MCHC: 32.8 g/dL (ref 30.0–36.0)
MCV: 84.9 fL (ref 80.0–100.0)
Platelets: 321 K/uL (ref 150–400)
RBC: 4.96 MIL/uL (ref 3.87–5.11)
RDW: 13.3 % (ref 11.5–15.5)
WBC: 12.5 K/uL — ABNORMAL HIGH (ref 4.0–10.5)
nRBC: 0 % (ref 0.0–0.2)

## 2024-03-18 LAB — URINALYSIS, MICROSCOPIC (REFLEX)

## 2024-03-18 LAB — RESP PANEL BY RT-PCR (RSV, FLU A&B, COVID)  RVPGX2
Influenza A by PCR: NEGATIVE
Influenza B by PCR: NEGATIVE
Resp Syncytial Virus by PCR: NEGATIVE
SARS Coronavirus 2 by RT PCR: NEGATIVE

## 2024-03-18 LAB — LIPASE, BLOOD: Lipase: 17 U/L (ref 11–51)

## 2024-03-18 LAB — PREGNANCY, URINE: Preg Test, Ur: NEGATIVE

## 2024-03-18 MED ORDER — KETOROLAC TROMETHAMINE 30 MG/ML IJ SOLN
30.0000 mg | Freq: Once | INTRAMUSCULAR | Status: AC
  Administered 2024-03-18: 14:00:00 30 mg via INTRAVENOUS
  Filled 2024-03-18: qty 1

## 2024-03-18 MED ORDER — SODIUM CHLORIDE 0.9 % IV BOLUS
1000.0000 mL | Freq: Once | INTRAVENOUS | Status: AC
  Administered 2024-03-18: 12:00:00 1000 mL via INTRAVENOUS

## 2024-03-18 MED ORDER — ONDANSETRON HCL 4 MG/2ML IJ SOLN
4.0000 mg | Freq: Once | INTRAMUSCULAR | Status: AC
  Administered 2024-03-18: 12:00:00 4 mg via INTRAVENOUS
  Filled 2024-03-18: qty 2

## 2024-03-18 MED ADMIN — Sodium Chloride IV Soln 0.9%: 1000 mL | INTRAVENOUS | @ 15:00:00 | NDC 00264580200

## 2024-03-18 NOTE — ED Notes (Signed)
Given po fluids 

## 2024-03-18 NOTE — Telephone Encounter (Signed)
 Patient reports, she has been vomiting since 4 pm yesterday. She reports, she has dry mouth, decreased urine output, mild dizziness. Patient reports, she feels as if she is dehydrated. She reports, she has vomited 50 times, has diarrhea >7 episodes. She is not able to hold down any liquid or fluids. She has not passed out.   Due to s/s of dehydration, recommended for patient to be seen in the ED now. Patient agrees and verbalized understanding, Patient to have someone drive her.

## 2024-03-18 NOTE — Telephone Encounter (Signed)
 Copied from CRM #[id number]. Topic: Clinical Concerns - Medical Question >> Mar 18, 2024  8:19 AM Almetta NOVAK wrote: Madelynne, Lasker is calling for clinical concerns (Ask: If symptom currently happening or happened earlier today? Must Review Emergent List for symptoms. Document Name of Triage Nurse/BH Rep taking the call when applicable)   Include all details related to the request(s) below: Patient called in stating she has been vomiting from 4:00 AM yesterday and needs to know if she should go the urgent care of ED for fluids     Confirm and type the Best Contact Number below:  Patient/caller contact number:  [phone]           [] Home  [x] Mobile  [] Work [] Other   [x] Okay to leave a voicemail   Medication List:  Current Outpatient Medications:    breast pump devi, 1 Device by miscellaneous route as needed (breast feeding)., Disp: 1 each, Rfl: 0   dextroamphetamine-amphetamine (ADDERALL) 15 mg tablet, Take 1 tablet (15 mg total) by mouth 3 (three) times a day., Disp: 90 tablet, Rfl: 0   dextroamphetamine-amphetamine (ADDERALL) 15 mg tablet, Take 1 tablet (15 mg total) by mouth 3 (three) times a day., Disp: 90 tablet, Rfl: 0   esomeprazole  (NexIUM ) 20 mg DR capsule, Take 40 mg by mouth daily as needed., Disp: , Rfl:    levocetirizine (XYZAL) 5 mg tablet, Take 1 tablet (5 mg total) by mouth daily., Disp: 90 tablet, Rfl: 2   norethindrone-e.estradioL-iron 1 mg-10 mcg (24)/10 mcg (2) tab, Take 1 tablet (10 mcg total) by mouth daily., Disp: 90 tablet, Rfl: 3   prenatal vit,calc76/iron/folic (PNV 29-1 ORAL), Take 1 tablet by mouth daily., Disp: , Rfl:    riboflavin, vitamin B2, 100 mg tab, Take 100 mg by mouth 2 (two) times a day., Disp: 60 tablet, Rfl: 3     Medication Request/Refills: Pharmacy Information (if applicable)   [x] Not Applicable       []  Pharmacy listed  Send Medication Request to:                                                 [] Pharmacy not listed  (added to pharmacy list in Epic) Send Medication Request to:      Listed Pharmacies: [id number] - [place], [place] - [address] AT [facility] [address] - PHONE: [phone] - FAX: [phone] [id number] - [place], [place] - [address] - PHONE: [phone] - FAX: [phone]

## 2024-03-18 NOTE — ED Triage Notes (Signed)
 Pt to triage via W/C. Reports N/V/D since 4 pm yesterday. Daughter with recent stomach virus . States feels as if she is going to pass out and thinks iron is low. Mild abdominal pain. Vomiting liquids in triage. Advised to remain NPO at this time

## 2024-03-18 NOTE — ED Provider Notes (Signed)
 [facility] Provider Note   CSN: [id number] Arrival date & time: 03/18/24  1014     Patient presents with: Emesis   Connie Murillo is a 38 y.o. female.   Pt is a 38 yo female with pmhx significant for ADD, GERD, iron deficiency anemia and hx esophageal stricture.  Her baby  has a stomach bug with n/v/d.  She is getting better.  Pt started vomiting with diarrhea yesterday around 4 pm.  She has not been able to keep anything down. She denies f/c.  She is scheduled for an iron infusion tomorrow and wants to know if she can get it today.  She feels dizzy.  Mild abd pain.        Prior to Admission medications  Medication Sig Start Date End Date Taking? Authorizing Provider  promethazine (PHENERGAN) 25 MG suppository Place 1 suppository (25 mg total) rectally every 6 (six) hours as needed for nausea or vomiting. 03/18/24  Yes Dean Clarity, MD  acetaminophen -codeine  (TYLENOL  #3) 300-30 MG tablet Take 1-2 tablets by mouth every 8 (eight) hours as needed for moderate pain. 06/16/19   Vernetta Lonni GRADE, MD  amphetamine-dextroamphetamine (ADDERALL) 15 MG tablet Take 1 tablet by mouth 3 (three) times daily.    [provider]  cyclobenzaprine  (FLEXERIL ) 10 MG tablet Take 1 tablet (10 mg total) by mouth 2 (two) times daily as needed for muscle spasms. 06/16/19   Vernetta Lonni GRADE, MD  diazepam  (VALIUM ) 5 MG tablet Take one by mouth one hour before MRI, repeat as needed 07/12/19   Vernetta Lonni GRADE, MD  drospirenone-ethinyl estradiol (YAZ) 3-0.02 MG per tablet Take 1 tablet by mouth daily.      [provider]  esomeprazole  (NEXIUM ) 40 MG capsule Take 1 capsule (40 mg total) by mouth daily. 10/23/10 10/23/11  Norleen Lynwood ORN, MD  ferrous sulfate 325 (65 FE) MG tablet Take 325 mg by mouth daily with breakfast.    [provider]  HYDROcodone -acetaminophen  (NORCO/VICODIN) 5-325 MG tablet Take 1-2 tablets by  mouth 3 (three) times daily as needed for moderate pain. 07/07/19   Vernetta Lonni GRADE, MD  lisdexamfetamine  (VYVANSE ) 60 MG capsule Take 1 capsule (60 mg total) by mouth every morning. 10/22/11   Georgian, Doe-Hyun R, DO  methylPREDNISolone  (MEDROL ) 4 MG tablet Medrol  dose pack. Take as instructed 06/16/19   Vernetta Lonni GRADE, MD  Multiple Vitamin (MULTIVITAMIN) tablet Take 1 tablet by mouth daily.      [provider]  ondansetron  (ZOFRAN  ODT) 4 MG disintegrating tablet Take 1 tablet (4 mg total) by mouth every 8 (eight) hours as needed for nausea or vomiting. 07/07/19   Vernetta Lonni GRADE, MD    Allergies: Augmentin [amoxicillin-pot clavulanate], Ceftin [cefuroxime], and Shellfish allergy    Review of Systems  Gastrointestinal:  Positive for diarrhea, nausea and vomiting.  All other systems reviewed and are negative.   Updated Vital Signs BP 120/70 (BP Location: Right Arm)   Pulse (!) 104   Temp 98 F (36.7 C) (Oral)   Resp 16   LMP 02/23/2024   SpO2 96%   Physical Exam Vitals and nursing note reviewed.  Constitutional:      Appearance: Normal appearance. She is obese. She is ill-appearing.  HENT:     Head: Normocephalic and atraumatic.     Right Ear: External ear normal.     Left Ear: External ear normal.     Nose: Nose normal.  Mouth/Throat:     Mouth: Mucous membranes are dry.  Eyes:     Extraocular Movements: Extraocular movements intact.     Conjunctiva/sclera: Conjunctivae normal.     Pupils: Pupils are equal, round, and reactive to light.  Cardiovascular:     Rate and Rhythm: Regular rhythm. Tachycardia present.     Pulses: Normal pulses.     Heart sounds: Normal heart sounds.  Pulmonary:     Effort: Pulmonary effort is normal.     Breath sounds: Normal breath sounds.  Abdominal:     General: Abdomen is flat. Bowel sounds are normal.     Palpations: Abdomen is soft.  Musculoskeletal:        General: Normal range of motion.     Cervical  back: Normal range of motion and neck supple.  Skin:    General: Skin is warm.     Capillary Refill: Capillary refill takes less than 2 seconds.  Neurological:     General: No focal deficit present.     Mental Status: She is alert and oriented to person, place, and time.  Psychiatric:        Mood and Affect: Mood normal.        Behavior: Behavior normal.        Thought Content: Thought content normal.        Judgment: Judgment normal.     (all labs ordered are listed, but only abnormal results are displayed) Labs Reviewed  COMPREHENSIVE METABOLIC PANEL WITH GFR - Abnormal; Notable for the following components:      Result Value   Glucose, Bld 148 (*)    Calcium 8.7 (*)    All other components within normal limits  CBC - Abnormal; Notable for the following components:   WBC 12.5 (*)    All other components within normal limits  URINALYSIS, ROUTINE W REFLEX MICROSCOPIC - Abnormal; Notable for the following components:   Protein, ur 30 (*)    All other components within normal limits  URINALYSIS, MICROSCOPIC (REFLEX) - Abnormal; Notable for the following components:   Bacteria, UA MANY (*)    All other components within normal limits  RESP PANEL BY RT-PCR (RSV, FLU A&B, COVID)  RVPGX2  LIPASE, BLOOD  PREGNANCY, URINE    EKG: None  Radiology: No results found.   Procedures   Medications Ordered in the ED  sodium chloride  0.9 % bolus 1,000 mL (0 mLs Intravenous Stopped 03/18/24 1305)  ondansetron  (ZOFRAN ) injection 4 mg (4 mg Intravenous Given 03/18/24 1138)  ketorolac  (TORADOL ) 30 MG/ML injection 30 mg (30 mg Intravenous Given 03/18/24 1400)  sodium chloride  0.9 % bolus 1,000 mL (1,000 mLs Intravenous New Bag/Given 03/18/24 1443)                                    Medical Decision Making Amount and/or Complexity of Data Reviewed Labs: ordered.  Risk Prescription drug management.   This patient presents to the ED for concern of n/v/d, this involves an  extensive number of treatment options, and is a complaint that carries with it a high risk of complications and morbidity.  The differential diagnosis includes viral illness, covid/flu/rsv, electrolyte abn, infection   Co morbidities that complicate the patient evaluation  ADD, GERD, iron deficiency anemia and hx esophageal stricture   Additional history obtained:  Additional history obtained from epic chart review    Lab Tests:  I Ordered,  and personally interpreted labs.  The pertinent results include:  cbc with slight elevation of wbc at 12.5; ua + protein, contaminated, no  uti; preg neg  Medicines ordered and prescription drug management:  I ordered medication including ivfs/zofran   for sx  Reevaluation of the patient after these medicines showed that the patient improved I have reviewed the patients home medicines and have made adjustments as needed   Test Considered:  ct   Critical Interventions:  ivfs  Problem List / ED Course:  N/v/d:  likely viral.  She is feeling much better and is tolerating po.  No diarrhea here.  No vomiting since she received meds.  She is stable for d/c.  Return if worse.  F/u with pcp.   Reevaluation:  After the interventions noted above, I reevaluated the patient and found that they have :improved   Social Determinants of Health:  Lives at home   Dispostion:  After consideration of the diagnostic results and the patients response to treatment, I feel that the patent would benefit from discharge with outpatient f/u.       Final diagnoses:  Dehydration  Nausea vomiting and diarrhea    ED Discharge Orders          Ordered    promethazine (PHENERGAN) 25 MG suppository  Every 6 hours PRN        03/18/24 1508               Dorinne Graeff, MD 03/18/24 1510

## 2024-03-18 NOTE — ED Notes (Signed)
 Given crackers, denies n/v at present

## 2024-04-25 ENCOUNTER — Emergency Department (HOSPITAL_BASED_OUTPATIENT_CLINIC_OR_DEPARTMENT_OTHER)
Admission: EM | Admit: 2024-04-25 | Discharge: 2024-04-25 | Disposition: A | Discharge status: Other Acute Inpatient Hospital | Attending: Emergency Medicine | Admitting: Emergency Medicine

## 2024-04-25 DIAGNOSIS — W44F3XA Food entering into or through a natural orifice, initial encounter: Secondary | ICD-10-CM | POA: Diagnosis not present

## 2024-04-25 DIAGNOSIS — D72829 Elevated white blood cell count, unspecified: Secondary | ICD-10-CM | POA: Insufficient documentation

## 2024-04-25 DIAGNOSIS — T18128A Food in esophagus causing other injury, initial encounter: Secondary | ICD-10-CM | POA: Diagnosis present

## 2024-04-25 LAB — CBC WITH DIFFERENTIAL/PLATELET
Abs Immature Granulocytes: 0.05 10*3/uL (ref 0.00–0.07)
Basophils Absolute: 0.1 10*3/uL (ref 0.0–0.1)
Basophils Relative: 1 %
Eosinophils Absolute: 1.4 10*3/uL — ABNORMAL HIGH (ref 0.0–0.5)
Eosinophils Relative: 11 %
HCT: 39.4 % (ref 36.0–46.0)
Hemoglobin: 12.8 g/dL (ref 12.0–15.0)
Immature Granulocytes: 0 %
Lymphocytes Relative: 13 %
Lymphs Abs: 1.7 10*3/uL (ref 0.7–4.0)
MCH: 28.4 pg (ref 26.0–34.0)
MCHC: 32.5 g/dL (ref 30.0–36.0)
MCV: 87.6 fL (ref 80.0–100.0)
Monocytes Absolute: 0.5 10*3/uL (ref 0.1–1.0)
Monocytes Relative: 4 %
Neutro Abs: 9 10*3/uL — ABNORMAL HIGH (ref 1.7–7.7)
Neutrophils Relative %: 71 %
Platelets: 307 10*3/uL (ref 150–400)
RBC: 4.5 MIL/uL (ref 3.87–5.11)
RDW: 14.1 % (ref 11.5–15.5)
WBC: 12.7 10*3/uL — ABNORMAL HIGH (ref 4.0–10.5)
nRBC: 0 % (ref 0.0–0.2)

## 2024-04-25 LAB — COMPREHENSIVE METABOLIC PANEL WITH GFR
ALT: 22 U/L (ref 0–44)
AST: 20 U/L (ref 15–41)
Albumin: 4.3 g/dL (ref 3.5–5.0)
Alkaline Phosphatase: 72 U/L (ref 38–126)
Anion gap: 11 (ref 5–15)
BUN: 9 mg/dL (ref 6–20)
CO2: 22 mmol/L (ref 22–32)
Calcium: 9.2 mg/dL (ref 8.9–10.3)
Chloride: 108 mmol/L (ref 98–111)
Creatinine, Ser: 0.65 mg/dL (ref 0.44–1.00)
GFR, Estimated: >60 mL/min
Glucose, Bld: 93 mg/dL (ref 70–99)
Potassium: 3.8 mmol/L (ref 3.5–5.1)
Sodium: 140 mmol/L (ref 135–145)
Total Bilirubin: 0.3 mg/dL (ref 0.0–1.2)
Total Protein: 7.2 g/dL (ref 6.5–8.1)

## 2024-04-25 LAB — HCG, SERUM, QUALITATIVE: Preg, Serum: NEGATIVE

## 2024-04-25 MED ORDER — GLUCAGON HCL RDNA (DIAGNOSTIC) 1 MG IJ SOLR
1.0000 mg | Freq: Once | INTRAMUSCULAR | Status: AC
  Administered 2024-04-25: 1 mg via INTRAVENOUS
  Filled 2024-04-25: qty 1

## 2024-04-25 MED ORDER — NITROGLYCERIN 0.4 MG SL SUBL
0.4000 mg | SUBLINGUAL_TABLET | Freq: Once | SUBLINGUAL | Status: AC
  Administered 2024-04-25: 0.4 mg via SUBLINGUAL
  Filled 2024-04-25: qty 1

## 2024-04-25 MED ORDER — ONDANSETRON HCL 4 MG/2ML IJ SOLN
4.0000 mg | Freq: Once | INTRAMUSCULAR | Status: AC
  Administered 2024-04-25: 4 mg via INTRAVENOUS
  Filled 2024-04-25: qty 2

## 2024-04-25 NOTE — ED Notes (Signed)
 [employer] printer is not working.

## 2024-04-25 NOTE — ED Provider Notes (Signed)
 " [facility] Provider Note   CSN: [id number] Arrival date & time: 04/25/24  1028     Patient presents with: Dysphagia   Connie Murillo is a 39 y.o. female with h/o GERD and esophageal stricture presents to the ER today for evaluation of potential food bolus. The patient reports that she was eating roast beef last night and felt something stuck in her lower throat around 2000. Since then, she has not been able to tolerate foods, fluids, or her secretions. She reports that she has had this before, but is usually able to pass it. She presented to the ER as she felt nothing was working. Last endoscopy was 2018 and she reports last dilation was before that.   HPI     Prior to Admission medications  Medication Sig Start Date End Date Taking? Authorizing Provider  acetaminophen -codeine  (TYLENOL  #3) 300-30 MG tablet Take 1-2 tablets by mouth every 8 (eight) hours as needed for moderate pain. 06/16/19   Vernetta Lonni GRADE, MD  amphetamine-dextroamphetamine (ADDERALL) 15 MG tablet Take 1 tablet by mouth 3 (three) times daily.    [provider]  cyclobenzaprine  (FLEXERIL ) 10 MG tablet Take 1 tablet (10 mg total) by mouth 2 (two) times daily as needed for muscle spasms. 06/16/19   Vernetta Lonni GRADE, MD  diazepam  (VALIUM ) 5 MG tablet Take one by mouth one hour before MRI, repeat as needed 07/12/19   Vernetta Lonni GRADE, MD  drospirenone-ethinyl estradiol (YAZ) 3-0.02 MG per tablet Take 1 tablet by mouth daily.      [provider]  esomeprazole  (NEXIUM ) 40 MG capsule Take 1 capsule (40 mg total) by mouth daily. 10/23/10 10/23/11  Norleen Lynwood ORN, MD  ferrous sulfate 325 (65 FE) MG tablet Take 325 mg by mouth daily with breakfast.    [provider]  HYDROcodone -acetaminophen  (NORCO/VICODIN) 5-325 MG tablet Take 1-2 tablets by mouth 3 (three) times daily as needed for moderate pain. 07/07/19   Vernetta Lonni GRADE,  MD  lisdexamfetamine  (VYVANSE ) 60 MG capsule Take 1 capsule (60 mg total) by mouth every morning. 10/22/11   Georgian, Doe-Hyun R, DO  methylPREDNISolone  (MEDROL ) 4 MG tablet Medrol  dose pack. Take as instructed 06/16/19   Vernetta Lonni GRADE, MD  Multiple Vitamin (MULTIVITAMIN) tablet Take 1 tablet by mouth daily.      [provider]  ondansetron  (ZOFRAN  ODT) 4 MG disintegrating tablet Take 1 tablet (4 mg total) by mouth every 8 (eight) hours as needed for nausea or vomiting. 07/07/19   Vernetta Lonni GRADE, MD  promethazine (PHENERGAN) 25 MG suppository Place 1 suppository (25 mg total) rectally every 6 (six) hours as needed for nausea or vomiting. 03/18/24   Haviland, Julie, MD    Allergies: Augmentin [amoxicillin-pot clavulanate], Ceftin [cefuroxime], and Shellfish allergy    Review of Systems  SEE HPI  Updated Vital Signs BP 133/79   Pulse 80   Temp 98.6 F (37 C) (Oral)   Resp 16   SpO2 99%   Physical Exam Vitals and nursing note reviewed.  Constitutional:      Appearance: She is not toxic-appearing.     Comments: Uncomfortable, but not toxic appearing  HENT:     Mouth/Throat:     Mouth: Mucous membranes are moist.     Comments: Normal phonation. No drooling, but is spitting secretions Eyes:     General: No scleral icterus. Cardiovascular:     Rate and Rhythm: Normal rate.  Pulmonary:  Effort: Pulmonary effort is normal. No respiratory distress.     Breath sounds: Normal breath sounds. No stridor.  Abdominal:     Palpations: Abdomen is soft.  Skin:    General: Skin is warm and dry.  Neurological:     Mental Status: She is alert.     (all labs ordered are listed, but only abnormal results are displayed) Labs Reviewed  PREGNANCY, URINE  CBC WITH DIFFERENTIAL/PLATELET  COMPREHENSIVE METABOLIC PANEL WITH GFR  HCG, SERUM, QUALITATIVE    EKG: None  Radiology: No results found.  Procedures   Medications Ordered in the ED  nitroGLYCERIN   (NITROSTAT ) SL tablet 0.4 mg (has no administration in time range)  glucagon  (human recombinant) (GLUCAGEN ) injection 1 mg (1 mg Intravenous Given 04/25/24 1105)    Medical Decision Making Amount and/or Complexity of Data Reviewed Labs: ordered.  Risk Prescription drug management.   39 y.o. female presents to the ER for evaluation of globus sensation. Differential diagnosis includes but is not limited to food bolus, esophagitis, perforation. Vital signs unremarkable. Physical exam as noted above.   I independently reviewed and interpreted the patient's labs.  Mildly elevated white blood cell count of 12.7.  Neutrophil count at 9.0 with lisinopril count at 1.4.  Could be possible stress reaction.  hCG is negative.  CMP shows no electrolyte or LFT abnormality.  Unfortunately, glucagon , crackers, Sprite, subungual nitro were unsuccessful.  Will consult GI.  Patient would like to be treated at [facility].  Will call PAL line for possible transfer over.  After speaking with PAL line, patient will be transferred over to [facility] emergency department.  Dr. Dorn Hoover is the excepting physician.  Dr. Noah is the GI provider on call who accepts the patient as well and will be performing the endoscopy. Ambulance or POV transfer was offered to the patient. She chose POV and her husband will be taking her. Risks and benefits of transfer were discussed with her including delay in care, MVC, and death. She verbalizes understanding and agrees for transfer. IV will be secured for transfer.   Patient transferred over to [facility].  I discussed this case with my attending physician who cosigned this note including patient's presenting symptoms, physical exam, and planned diagnostics and interventions. Attending physician stated agreement with plan or made changes to plan which were implemented.   Attending physician assessed patient at bedside.  Portions of this report may have been  transcribed using voice recognition software. Every effort was made to ensure accuracy; however, inadvertent computerized transcription errors may be present.    Final diagnoses:  Food impaction of esophagus, initial encounter    ED Discharge Orders     None          Bernis Ernst, DEVONNA 04/25/24 1304    Patsey Lot, MD 04/25/24 1357  "

## 2024-04-25 NOTE — ED Notes (Signed)
 Called [facility] Charge RN and gave report. Pt is leaving now with her husband driving. No acute distress. Airway intact, no hypersalivation noted. Pt is ambulatory without assistance. He will take her direct to [facility] ER and only stop for fuel if necessary. They will call EMS if anything gets worse. Paperwork and EMTALA in hand.

## 2024-04-25 NOTE — ED Triage Notes (Signed)
 Patient states last night she felt a piece of meat get lodged in her esophagus. This morning unable to swallow saliva. Still feels like meat is stuck.

## 2024-04-25 NOTE — ED Notes (Signed)
 Assisting PA with initiating an IV. Pt is actively dry heaving and drooling due to inability to pass anything via swallowing. Pt ate roast beef last and can taste the same flavor. Had immediate onset of symptoms and has had food bolus' in the past as well. Never had to have endo remove it, but has had an endoscopy.

## 2024-04-25 NOTE — ED Notes (Signed)
 Pt is still having excessive drooling and dry heaving when it builds up, and cannot swallow crackers, sprite, or her own saliva. No active emesis however. Minor nausea noted. Pt is spitting into an emesis bag, airway intact and protected.

## 2024-04-25 NOTE — Discharge Instructions (Signed)
 Go straight to [facility] ER. Obey traffic laws and speed limit signs. If you start to have any new or worsening change, please pull over and call 911.   You will check in to the ER. Dr. Dorn Hoover is your accepting provider.
